# Patient Record
Sex: Male | Born: 1990
Health system: Southern US, Community
[De-identification: ages and names within clinical notes are randomized; demographics above are authoritative.]

## PROBLEM LIST (undated history)

## (undated) DIAGNOSIS — F988 Other specified behavioral and emotional disorders with onset usually occurring in childhood and adolescence: Secondary | ICD-10-CM

## (undated) DIAGNOSIS — F819 Developmental disorder of scholastic skills, unspecified: Secondary | ICD-10-CM

## (undated) HISTORY — DX: Other specified behavioral and emotional disorders with onset usually occurring in childhood and adolescence: F98.8

## (undated) HISTORY — DX: Developmental disorder of scholastic skills, unspecified: F81.9

---
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
## 2011-01-08 NOTE — Letter (Signed)
Summary: Patient Birth Certificate & Adoption Papers  Patient Birth Certificate & Adoption Papers   Imported By: Lanelle Bal 10/17/2010 10:11:55  _____________________________________________________________________  External Attachment:    Type:   Image     Comment:   External Document

## 2011-05-07 ENCOUNTER — Encounter: Payer: Self-pay | Admitting: Internal Medicine

## 2011-05-08 ENCOUNTER — Encounter: Payer: Self-pay | Admitting: Internal Medicine

## 2011-05-08 ENCOUNTER — Ambulatory Visit (INDEPENDENT_AMBULATORY_CARE_PROVIDER_SITE_OTHER): Payer: BC Managed Care – PPO | Admitting: Internal Medicine

## 2011-05-08 VITALS — BP 120/80 | HR 78 | Ht 72.5 in | Wt 176.0 lb

## 2011-05-08 DIAGNOSIS — Z00129 Encounter for routine child health examination without abnormal findings: Secondary | ICD-10-CM

## 2011-05-08 DIAGNOSIS — F8189 Other developmental disorders of scholastic skills: Secondary | ICD-10-CM

## 2011-05-08 DIAGNOSIS — Z0100 Encounter for examination of eyes and vision without abnormal findings: Secondary | ICD-10-CM

## 2011-05-08 DIAGNOSIS — Z789 Other specified health status: Secondary | ICD-10-CM

## 2011-05-08 DIAGNOSIS — Z23 Encounter for immunization: Secondary | ICD-10-CM

## 2011-05-08 DIAGNOSIS — Z0000 Encounter for general adult medical examination without abnormal findings: Secondary | ICD-10-CM | POA: Insufficient documentation

## 2011-05-08 DIAGNOSIS — Z113 Encounter for screening for infections with a predominantly sexual mode of transmission: Secondary | ICD-10-CM

## 2011-05-08 DIAGNOSIS — F988 Other specified behavioral and emotional disorders with onset usually occurring in childhood and adolescence: Secondary | ICD-10-CM

## 2011-05-08 DIAGNOSIS — Z973 Presence of spectacles and contact lenses: Secondary | ICD-10-CM

## 2011-05-08 LAB — BASIC METABOLIC PANEL
Calcium: 9.7 mg/dL (ref 8.4–10.5)
Chloride: 106 mEq/L (ref 96–112)
Creatinine, Ser: 0.9 mg/dL (ref 0.4–1.5)
Glucose, Bld: 79 mg/dL (ref 70–99)
Potassium: 4.4 mEq/L (ref 3.5–5.1)

## 2011-05-08 LAB — CBC WITH DIFFERENTIAL/PLATELET
Eosinophils Absolute: 0.1 10*3/uL (ref 0.0–0.7)
Hemoglobin: 15.1 g/dL (ref 13.0–17.0)
Lymphocytes Relative: 38.7 % (ref 12.0–46.0)
Lymphs Abs: 2 10*3/uL (ref 0.7–4.0)
MCHC: 33.9 g/dL (ref 30.0–36.0)
MCV: 83.2 fl (ref 78.0–100.0)
Monocytes Relative: 6.7 % (ref 3.0–12.0)
Neutrophils Relative %: 51 % (ref 43.0–77.0)
Platelets: 209 10*3/uL (ref 150.0–400.0)
RBC: 5.35 Mil/uL (ref 4.22–5.81)
RDW: 12.8 % (ref 11.5–14.6)

## 2011-05-08 LAB — LIPID PANEL
LDL Cholesterol: 51 mg/dL (ref 0–99)
Total CHOL/HDL Ratio: 2

## 2011-05-08 LAB — HEPATIC FUNCTION PANEL: Alkaline Phosphatase: 80 U/L (ref 39–117)

## 2011-05-08 NOTE — Progress Notes (Signed)
Subjective:     History was provided by the Patient.   Steven Porter is a 20 y.o. male who is here for this wellness visit. He is here to reestablish doctor many years absence. Brought in by his mother today . He is generally well with no history of hospitalizations or major injuries. He recently graduated from high school and is to attend culinary arts school in [place].  He has a remote history of ADHD and an LD when he was much younger and at some point was on medication. He states that he has been able to adapt and try to focus in his schoolwork. He has a driver's license no major accidents. He thinks he will do okay in school without medication.  He would like to get an STI screen despite no symptoms monogamous no condoms   Current Issues: Current concerns include:Development STD testing  H (Home) Family Relationships: good Communication: good with parents Responsibilities: has responsibilities at home  E (Education): Grades: As, Bs, Cs and going to [employer], [employer] in [employer]: good attendance Future Plans: college  A (Activities) Sports: no sports Exercise: Yes  Activities: Cooking, poetry and running Friends: Yes   A (Auton/Safety) Auto: wears seat belt Bike: does not ride Safety: can swim and uses sunscreen  D (Diet) Diet: balanced diet Risky eating habits: none Intake: low fat diet Body Image: positive body image  Drugs Tobacco: History  Alcohol: History Drugs: History  Sex Activity: sexually active  Suicide Risk Emotions: anger and a little; working on it. Depression: denies feelings of depression Suicidal: denies suicidal ideation  ROS; vomited once  After am milkshake.   12 system review  otherwise negative or as above   Objective:     Filed Vitals:   05/08/11 0944  BP: 120/80  Pulse: 78  Height: 6\' 6"  (1.981 m)  Weight: 176 lb (79.833 kg)   Physical Exam: Vital signs reviewed [id number] is a well-developed well-nourished alert  cooperative  AA male  who appears   stated age in no acute distress.  HEENT: normocephalic  traumatic , Eyes: PERRL EOM's full, conjunctiva clear, Nares: patent no deformity discharge or tenderness., Ears: no deformity EAC's clear TMs with normal landmarks. Mouth: clear OP, no lesions, edema.  Moist mucous membranes. Dentition in adequate repair. NECK: supple without masses, thyromegaly or bruits. CHEST/PULM:  Clear to auscultation and percussion breath sounds equal no wheeze , rales or rhonchi. No chest wall deformities or tenderness. CV: PMI is nondisplaced, S1 S2 no gallops, murmurs, rubs. Peripheral pulses are full without delay.No JVD .  ABDOMEN: Bowel sounds normal nontender  No guard or rebound, no hepato splenomegal no CVA tenderness.  No hernia. Extremtities:  No clubbing cyanosis or edema, no acute joint swelling or redness no focal atrophy  No scoliosis  .  NEURO:  Oriented x3, cranial nerves 3-12 appear to be intact, no obvious focal weakness,gait within normal limits no abnormal reflexes or asymmetrical SKIN: No acute rashes normal turgor, color, no bruising or petechiae. Minimal acne papules on forehead PSYCH: Oriented, good eye contact, no obvious depression anxiety, cognition and judgment appear normal. LN:  No cervical axillary or inguinal adenopathy Ext Gu: no masses  Tanner 5      Assessment:    Healthy 19 y.o. male .   History of ADD and LD  Apparently tolerating well and has adapted has not been on medication for quite a while. Did discuss this as an option in the future or retest  if needed. He will probably do well with hands-on learning.   Plan:   1. Anticipatory guidance discussed. Nutrition, Sick Care, Safety and Handout given sti prevention .  Expectant management. At school.  Counseled regarding healthy nutrition, exercise, sleep, injury prevention, calcium vit d and healthy weight .  Recommended immunizations discussed and explained. Questions answered.  Hep  a Menactra and HPV.. HO   2. Follow-up visit in 12 months for next wellness visit, or sooner as needed.

## 2011-05-08 NOTE — Patient Instructions (Signed)
75-19 Year Old Adolescent Visit SCHOOL PERFORMANCE: After high school completion, the older teen may be attending college, Scientist, product/process development or vocational school, or entering the [employer] or the work force. SOCIAL AND EMOTIONAL DEVELOPMENT: The older adolescent establishes adult relationships and explores sexual identity. Teens may be living at home or in a college dorm or apartment. Increasing independence is important as teens enter adulthood. Throughout adolescence, teens should assume care of their own health care. IMMUNIZATIONS: Most older teens should be fully vaccinated. A booster dose of Tdap (tetanus, diphtheria, and pertussis, or "whooping cough"), a dose of meningococcal vaccine to protect against a certain type of bacterial meningitis, Hepatitis A, human papillomarvirus (HPV), chicken pox, or measles vaccines may be indicated, if not given at an earlier age. Annual influenza or "flu" vaccination should be considered during flu season.  TESTING: Annual screening for vision and hearing problems is recommended. Vision should be screened objectively at least once between 20 and 34 years of age. The teen may be screened for anemia or tuberculosis. Teens should have a blood test to check for high cholesterol during this time period. Teens should be screened for use of alcohol and drugs. If the teenager is sexually active, screening for sexually transmitted infections, pregnancy, or HIV may be performed. Screening for cervical cancer should be performed within three years of beginning sexual activity. NUTRITION AND ORAL HEALTH  Adequate calcium intake is important. Take three servings of low fat milk and dairy products daily. For those who do not drink milk or consume dairy products, calcium enriched foods, such as juice, bread, or cereal; dark, green, leafy greens; or canned fish are alternate sources of calcium.   Drink plenty of water. Limit fruit juice to 8 to 12 ounces per day. Avoid sugary beverages  or sodas.   Discourage skipping meals, especially breakfast. Teens should eat a good variety of vegetables and fruits, as well as lean meats.   Avoid high fat, high salt and high sugar choices, such as candy, chips, and cookies.   Encourage teenagers to participate in meal planning and preparation.   Eat meals together as a family whenever possible. Encourage conversation at mealtime.   Limit fast food choices and eating out at restaurants.   Brush teeth twice a day and floss.   Schedule dental examinations twice a year.  DEVELOPMENT SLEEP Regular sleep habits are important. PHYSICAL, SOCIAL AND EMOTIONAL DEVELOPMENT  One hour of regular physical activity daily is recommended. Continue to participate in sports.   Encourage young adults to develop their own interests and consider community service or volunteerism.   Provide guidance to your older teen in making decisions about college and work plans.   Make sure that teens know that they should never be in a situation that makes them uncomfortable, and they should tell partners if they do not want to engage in sexual activity.   Talk to your teen about body image. Eating disorders may be noted at this time. Teens may also be concerned about being overweight. Monitor your teen for weight gain or loss.   Mood disturbances, depression, anxiety, alcoholism, or attention problems may be noted in teenagers. Talk to your doctor if you or your teenager has concerns about mental illness.   Negotiate limit setting and independent decision making.   Encourage your teen to handle conflict without physical violence.   Avoid loud noises which may impair hearing.   Limit television and computer time to 2 hours per day! Individuals who engage in  excessive sedentary activity are more likely to become overweight.  RISK BEHAVIORS  Sexually active teens need to take precautions against pregnancy and sexually transmitted infections. Talk to teens  about contraception.   Provide a tobacco-free and drug-free environment for your teen. Talk to your teen about drug, tobacco, and alcohol use among friends or at friends' homes. Make sure your teen knows that smoking tobacco or marijuana and taking drugs have health consequences and may impact brain development.   Teach your teens about appropriate use of other-the-counter or prescription medications.   Establish guidelines for driving and for riding with friends.   Talk to teens about the risks of drinking and driving or boating. Encourage your teen to call you if the teen or their friends have been drinking or using drugs.   Remind teenagers to wear seatbelts at all times in cars and life vests in boats.   Teens should always wear a properly fitted helmet when they are riding a bicycle.   Use caution with use of all terrain vehicles (ATV) or other motorized vehicles.   Do not keep handguns in the home. (If they are, the gun and ammunition should be locked separately and out of the teen's access).   Equip your home with smoke detectors and change the batteries regularly! Make sure that all family members know fire escape plans.   Teach teens not to swim alone and not to dive in shallow water.   All individuals should wear sunscreen which protects against UV-A and UV-B and is at least sun protection factor of 15 (SPF-15) or higher when out in the sun to minimize sun burning.  WHAT'S NEXT? Teenagers should visit their pediatrician or family physician yearly. By young adulthood, health care should be transitioned to a family physician or internal medicine specialist. Sexually active females may want to begin annual physical examinations with a gynecologist. Document Released: 02/18/2007  [facility] Patient Information 2011 [place], [place].  Will notify you  of labs when available.

## 2011-05-09 LAB — HIV ANTIBODY (ROUTINE TESTING W REFLEX): HIV: NONREACTIVE

## 2011-05-11 NOTE — Progress Notes (Signed)
Pt aware of results 

## 2011-07-10 ENCOUNTER — Ambulatory Visit: Payer: Self-pay | Admitting: Internal Medicine

## 2012-04-11 ENCOUNTER — Encounter: Payer: Self-pay | Admitting: Family

## 2012-04-11 ENCOUNTER — Ambulatory Visit (INDEPENDENT_AMBULATORY_CARE_PROVIDER_SITE_OTHER): Payer: BC Managed Care – PPO | Admitting: Family

## 2012-04-11 VITALS — BP 100/70 | Temp 98.5°F | Wt 131.0 lb

## 2012-04-11 DIAGNOSIS — Z202 Contact with and (suspected) exposure to infections with a predominantly sexual mode of transmission: Secondary | ICD-10-CM

## 2012-04-11 DIAGNOSIS — Z7251 High risk heterosexual behavior: Secondary | ICD-10-CM

## 2012-04-11 MED ORDER — AZITHROMYCIN 250 MG PO TABS: 500.0000 mg | ORAL_TABLET | Freq: Once | ORAL | Status: AC

## 2012-04-11 MED ORDER — CEFTRIAXONE SODIUM 1 G IJ SOLR: 1.0000 g | Freq: Once | INTRAMUSCULAR | Status: DC

## 2012-04-11 MED ORDER — CEFTRIAXONE SODIUM 1 G IJ SOLR
1.0000 g | Freq: Once | INTRAMUSCULAR | Status: AC
  Administered 2012-04-11: 1 g via INTRAMUSCULAR

## 2012-04-11 NOTE — Patient Instructions (Signed)
Chlamydia, Females and Males Chlamydia is an infection that can be found in the vagina, urethra, cervix, rectum and pelvic organs in the male. In the male, it most often causes urethritis. This happens when it infects the tube (urethra) that carries the urine out of the bladder. When Chlamydia causes urethritis, there may be burning with urination. In males, it may also infect the tubes that carry the sperm from the testicle. This causes pain in the testicles and infect the prostate gland. In females, an infection of the pelvic organs is also called PID (pelvic inflammatory disease). PID may be a cause of sudden (acute) lower abdominal/belly (pelvic) pain and fever. But with Chlamydia, the infection sometimes does not cause problems that you notice (asymptomatic). It may cause an abnormal or watery mucous-like discharge from the birth canal (vagina) or penis.  CAUSES  Chlamydia is caused by germs (bacteria) that are spread during sexual contact of the:  Genitals.   Mouth.   Rectum.  This infection may also be passed to a newborn baby coming through the infected birth canal. This causes eye and lung infections in the baby. Chlamydia often goes unnoticed. So it is easy to transmit it to a sexual partner without even knowing. SYMPTOMS  In females, symptoms may go unnoticed. Symptoms that are more noticeable can include:  Belly (abdominal) pain.   Painful intercourse.   Watery mucous-like discharge from the vagina.   Miscarriage.   Discomfort when urinating.   Inflammation of the rectum.  In males, symptoms include:  Burning with urination.   Pain in the testicles.   Watery mucous-like discharge from the penis.  It can cause longstanding (chronic) pelvic pain after frequent infections. TREATMENT   Chlamydia can be treated with medications which kill germs(antibiotics).   Inform all sexual partners about the infection. All sexual contacts need to be treated.   If you are pregnant,  do not take tetracycline type antibiotics.   PID can cause women to not be able to have children (sterile) if left untreated or if half-treated. It does this by scarring the tubes to the uterus (fallopian tubes). They carry the egg needed to form a baby. It is important to finish ALL medications given to you.   Sterility or future tubal (ectopic) pregnancies can occur in fully treated individuals. It is important to follow your prescribed treatment. That will lessen the chances of these problems.   This is a sexually transmitted infection. So you are also at risk for other sexually transmitted diseases. These include: Gonorrhea and HIV (AIDS). Testing may be done for the other sexually transmitted diseases if one disease is detected.   It is important to treat chlamydia as soon as possible. It can cause damage to other organs.  HOME CARE INSTRUCTIONS  Finish all medication as prescribed. Incomplete treatment will put you at risk for not being able to have children (sterility) and tubal pregnancy. If one sexually transmitted disease is discovered, often treatment will be started to cover other possible infections.   Only take over-the-counter or prescription medicines for pain, discomfort, or fever as directed by your caregiver.   Rest.   Eat a balanced diet and drink plenty of fluids.   Warning: This infection is contagious. Do not have sex until treatment is completed. Follow up at your caregiver's office or the clinic to which you were referred. If your diagnosis (learning what is wrong) is confirmed by culture or some other method, your recent sexual contacts need treatment. Even   if they are symptom free or have a negative culture or evaluation, they should be treated.   For the protection of your privacy, test results can not be given over the phone. Make sure you receive the results of your test. Ask how these results are to be obtained if you have not been informed. It is your  responsibility to obtain your test results.  PREVENTION   Women should use sanitary pads instead of tampons for vaginal discharge.   Wipe front to back after using the toilet and avoid douching.   Test for chlamydia if you are having an IUD inserted.   Practice safe sex, use condoms, have only one sex partner and be sure your sex partner is not having sex with others.   Ask your caregiver to test you for chlamydia at your regular checkups or sooner if you are having symptoms.   Ask for further information if you are pregnant.  SEEK IMMEDIATE MEDICAL CARE IF:   You develop an oral temperature above 102 F (38.9 C), not controlled by medications or lasting more than 2 days.   You develop an increase in pain.   You develop any type of abnormal discharge.   You develop vaginal bleeding and it is not time for your period.   You develop painful intercourse.  MAKE SURE YOU:   Understand these instructions.   Will watch your condition.   Will get help right away if you are not doing well or get worse.  Document Released: 11/23/2005 Document Revised: 11/12/2011 Document Reviewed: 06/28/2008 [employer] Patient Information 2012 [employer]. 

## 2012-04-11 NOTE — Progress Notes (Signed)
  Subjective:    Patient ID: Steven Porter, male    DOB: 30-Jul-1991, 21 y.o.   MRN: [id number]  HPI 21 year old [place] American male, nonsmoker, patient of Dr. Fabian Sharp is in today for sexually transmitted disease screening. Reports his girlfriend being positive for Chlamydia. She alleges that she had a from the hospital after giving birth to her 12-month-old baby. He's been having unprotected intercourse with her. Denies any discharge from his penis no burning with urination, no blood in his urine no abdominal pain, or back pain. He is heterosexual, sexually active with one male partner.   Review of Systems  Constitutional: Negative.   Respiratory: Negative.   Cardiovascular: Negative.   Gastrointestinal: Negative.  Negative for abdominal pain.  Genitourinary: Negative.  Negative for dysuria, urgency and genital sores.  Musculoskeletal: Negative.  Negative for back pain.  Psychiatric/Behavioral: Negative.    Past Medical History  Diagnosis Date  . ADD (attention deficit disorder)   . Learning disorder     History   Social History  . Marital Status: Single    Spouse Name: N/A    Number of Children: N/A  . Years of Education: N/A   Occupational History  . Not on file.   Social History Main Topics  . Smoking status: Current Some Day Smoker  . Smokeless tobacco: Never Used  . Alcohol Use: Yes     ocassionaly  . Drug Use: Yes    Special: Marijuana     In the past  . Sexually Active: Not on file   Other Topics Concern  . Not on file   Social History Narrative   Household of 4 negative TADFinished high school [place] go to [employer] in July  In [place].    No past surgical history on file.  Family History  Problem Relation Age of Onset  . Hypertension Mother   . Arthritis    . Sudden death Father   . Urolithiasis    . Colon cancer      GGF  . Other      HD Stroke Ht in [place]    No Known Allergies  No current outpatient prescriptions on file  prior to visit.    BP 100/70  Temp(Src) 98.5 F (36.9 C) (Oral)  Wt 131 lb (59.421 kg)chart    Objective:   Physical Exam  Constitutional: He is oriented to person, place, and time. He appears well-developed and well-nourished.  Cardiovascular: Normal rate, regular rhythm and normal heart sounds.   Pulmonary/Chest: Effort normal and breath sounds normal.  Abdominal: Soft. Bowel sounds are normal.  Genitourinary: Right testis shows no mass, no swelling and no tenderness. Left testis shows no mass, no swelling and no tenderness. Penile tenderness present.  Neurological: He is alert and oriented to person, place, and time.  Skin: Skin is warm and dry.  Psychiatric: He has a normal mood and affect.          Assessment & Plan:  Assessment: Exposure to sexually transmitted diseases, high risk sexual behavior  Plan: 1 g Rocephin given IM x1. Azithromycin 1 g by mouth x1. Refrain from sexual intercourse x1 week. Safe sex practices. Statistical information regarding STDs was discussed at this office visit. Patient verbalizes understanding of the importance of safe sex. Labs sent to include urine GC, HIV, HSV 1 and 2, RPR. Will notify patient pending results.

## 2012-04-12 LAB — RPR

## 2012-04-12 LAB — GC/CHLAMYDIA PROBE AMP, GENITAL
Chlamydia, DNA Probe: NEGATIVE
GC Probe Amp, Genital: NEGATIVE

## 2012-04-12 LAB — HSV(HERPES SIMPLEX VRS) I + II AB-IGG: HSV 2 Glycoprotein G Ab, IgG: <0.1 IV

## 2012-05-19 ENCOUNTER — Telehealth: Payer: Self-pay | Admitting: Internal Medicine

## 2012-05-19 NOTE — Telephone Encounter (Signed)
Pt is moving to the [place] area and wanted to know if DR. Panosh could suggest a doctor in that area

## 2012-05-19 NOTE — Telephone Encounter (Signed)
Not aware of specific doc to recommend

## 2012-05-20 NOTE — Telephone Encounter (Signed)
Mom states that "the young lady already called" about Dr. Rosezella [place] note

## 2012-07-27 ENCOUNTER — Encounter: Payer: Self-pay | Admitting: Internal Medicine

## 2012-07-27 ENCOUNTER — Ambulatory Visit (INDEPENDENT_AMBULATORY_CARE_PROVIDER_SITE_OTHER): Payer: BC Managed Care – PPO | Admitting: Internal Medicine

## 2012-07-27 VITALS — BP 100/70 | HR 70 | Temp 97.8°F | Ht 72.0 in | Wt 131.0 lb

## 2012-07-27 DIAGNOSIS — Z136 Encounter for screening for cardiovascular disorders: Secondary | ICD-10-CM

## 2012-07-27 DIAGNOSIS — F410 Panic disorder [episodic paroxysmal anxiety] without agoraphobia: Secondary | ICD-10-CM

## 2012-07-27 DIAGNOSIS — Z0000 Encounter for general adult medical examination without abnormal findings: Secondary | ICD-10-CM

## 2012-07-27 DIAGNOSIS — F17200 Nicotine dependence, unspecified, uncomplicated: Secondary | ICD-10-CM

## 2012-07-27 DIAGNOSIS — F4322 Adjustment disorder with anxiety: Secondary | ICD-10-CM

## 2012-07-27 DIAGNOSIS — R51 Headache: Secondary | ICD-10-CM

## 2012-07-27 DIAGNOSIS — Z720 Tobacco use: Secondary | ICD-10-CM

## 2012-07-27 LAB — BASIC METABOLIC PANEL WITH GFR
BUN: 12 mg/dL (ref 6–23)
CO2: 29 meq/L (ref 19–32)
Calcium: 9.7 mg/dL (ref 8.4–10.5)
Chloride: 104 meq/L (ref 96–112)
Creatinine, Ser: 0.9 mg/dL (ref 0.4–1.5)
GFR: 140.41 mL/min
Glucose, Bld: 83 mg/dL (ref 70–99)
Potassium: 4.3 meq/L (ref 3.5–5.1)
Sodium: 139 meq/L (ref 135–145)

## 2012-07-27 LAB — CBC WITH DIFFERENTIAL/PLATELET
Basophils Absolute: 0 K/uL (ref 0.0–0.1)
Basophils Relative: 0.8 % (ref 0.0–3.0)
Eosinophils Absolute: 0.2 K/uL (ref 0.0–0.7)
Eosinophils Relative: 2.7 % (ref 0.0–5.0)
HCT: 45.2 % (ref 39.0–52.0)
Hemoglobin: 14.9 g/dL (ref 13.0–17.0)
Lymphocytes Relative: 29.3 % (ref 12.0–46.0)
Lymphs Abs: 1.7 K/uL (ref 0.7–4.0)
MCHC: 32.9 g/dL (ref 30.0–36.0)
MCV: 84.3 fl (ref 78.0–100.0)
Monocytes Absolute: 0.4 K/uL (ref 0.1–1.0)
Monocytes Relative: 6.4 % (ref 3.0–12.0)
Neutro Abs: 3.5 K/uL (ref 1.4–7.7)
Neutrophils Relative %: 60.8 % (ref 43.0–77.0)
Platelets: 214 K/uL (ref 150.0–400.0)
RBC: 5.36 Mil/uL (ref 4.22–5.81)
RDW: 12.8 % (ref 11.5–14.6)
WBC: 5.7 K/uL (ref 4.5–10.5)

## 2012-07-27 LAB — T4, FREE: Free T4: 0.7 ng/dL (ref 0.60–1.60)

## 2012-07-27 LAB — HEPATIC FUNCTION PANEL
ALT: 14 U/L (ref 0–53)
AST: 16 U/L (ref 0–37)
Bilirubin, Direct: 0.1 mg/dL (ref 0.0–0.3)
Total Bilirubin: 1.4 mg/dL — ABNORMAL HIGH (ref 0.3–1.2)
Total Protein: 7.8 g/dL (ref 6.0–8.3)

## 2012-07-27 LAB — LIPID PANEL
Cholesterol: 131 mg/dL (ref 0–200)
HDL: 49.1 mg/dL
LDL Cholesterol: 62 mg/dL (ref 0–99)
Total CHOL/HDL Ratio: 3
Triglycerides: 100 mg/dL (ref 0.0–149.0)
VLDL: 20 mg/dL (ref 0.0–40.0)

## 2012-07-27 LAB — TSH: TSH: 0.91 u[IU]/mL (ref 0.35–5.50)

## 2012-07-27 NOTE — Progress Notes (Signed)
Subjective:    Patient ID: Steven Porter, male    DOB: 12/22/1990, 21 y.o.   MRN: [id number]  HPI Patient comes in today for preventive visit and follow-up of medical issues. Update  history since  last visit: Was seen may for std exposure  See note . Panic anxiety attack  Chest hurt and hard to breath and fell to knees and hard to breath.  Felt faint.some tingling.  Then calmed down    Once or twice   Episodes.  No sig etoh ocass caffiene tob  Recent migraines  More frequent  Lays down  Stress  Has 5 mos old infant mom in [place] went to visit and help now mother of child wont let him see the child and has a new BF?  His family here is supportive.  H (Home) Family Relationships: good Communication: good with parents Responsibilities: has responsibilities at home  E (Education): Grades: Graduated  HS  School: good attendance Future Plans: college back  But needs to get money for job  A (Activities) Sports: no sports Exercise: Yes  Activities: Dance, cooking Friends: Yes   A (Auton/Safety) Auto: wears seat belt Bike: does not ride Safety: can swim  D (Diet) Diet: Recently lost weight by eating healthier Risky eating habits: none Intake: adequate iron and calcium intake Body Image: good   Drugs Tobacco: Y Alcohol: No Drugs: No  Sex Activity:  Suicide Risk Emotions:  Depression: denies feelings of depression Suicidal: denies suicidal ideation caffiene once a day.  A pepsi    Review of Systems  ROS:  GEN/ HEENT: No fever, significant weight changes sweats vision problems hearing changes, CV/ PULM; No  cough, syncope,edema  change in exercise tolerance. GI /GU: No adominal pain, vomiting, change in bowel habits. No blood in the stool. No significant GU symptoms. SKIN/HEME: ,no acute skin rashes suspicious lesions or bleeding. No lymphadenopathy, nodules, masses.  NEURO/ PSYCH:  No neurologic signs such as weakness numbness. No depression anxiety. IMM/ Allergy:  No unusual infections.  Allergy .   REST of 12 system review negative except as per HPI  No outpatient encounter prescriptions on file as of 07/27/2012.       Objective:   Physical Exam BP 100/70  Pulse 70  Temp 97.8 F (36.6 C) (Oral)  Ht 6' (1.829 m)  Wt 131 lb (59.421 kg)  BMI 17.77 kg/m2  SpO2 97%   Physical Exam: Vital signs reviewed [id number] is a well-developed well-nourished alert cooperative  AA male  who appears   stated age in no acute distress.  HEENT: normocephalic  traumatic , Eyes: PERRL EOM's full, conjunctiva clear, Nares: patent no deformity discharge or tenderness., Ears: no deformity EAC's clear TMs with normal landmarks. Mouth: clear OP, no lesions, edema.  Moist mucous membranes. Dentition in adequate repair. NECK: supple without masses, thyromegaly or bruits. CHEST/PULM:  Clear to auscultation and percussion breath sounds equal no wheeze , rales or rhonchi. No chest wall deformities or tenderness. CV: PMI is nondisplaced, S1 S2 no gallops, murmurs, rubs. Peripheral pulses are full without delay.No JVD .  ABDOMEN: Bowel sounds normal nontender  No guard or rebound, no hepato splenomegal no CVA tenderness.  No hernia. Extremtities:  No clubbing cyanosis or edema, no acute joint swelling or redness no focal atrophy NEURO:  Oriented x3, cranial nerves 3-12 appear to be intact, no obvious focal weakness,gait within normal limits no abnormal reflexes or asymmetrical SKIN: No acute rashes normal turgor, color, no bruising or petechiae.  PSYCH: Oriented, good eye contact, no obvious depression anxiety, cognition and judgment appear normal. LN:  No cervical axillary or inguinal adenopathy EKG SR no acute changes sinus 60 early repolarization inferolateral leads  prob normal for age. j point elevation .      Assessment & Plan:  Preventive Health Care Counseled regarding healthy nutrition, exercise, sleep, injury prevention, calcium vit d and healthy weight  .Contraception and Sti prevention.  Poss panic attack anxiety based on hx and context and exam and ekg is normal but will get labs r/o metabolic thyroid disease . Migraines increased under stress    Lay down with help.  Try tylenol and track  Counseled.  Related stress has supports   Family and friends.   Strongly advised  counseling for panic issues  At this time would not use medication as intervention.  But need fu if  persistent or progressive   He is feeling better since back in [place] with his family.  ADD LD  Hx of same  Follow  Counseled.

## 2012-07-27 NOTE — Patient Instructions (Addendum)
  Will contact you about laboratory tests. I believe that you have anxiety related to life events.  Avoid excess alcohol tobacco and caffeine. These can aggravate anxiety and sleep disturbance   Increase exercise as this can help with sleep and anxiety  Suggest counseling to help get through the panic and anxiety we can add medications if advised by counselor or not getting better.  Calendar year headaches to look for triggers lack of sleep food and dehydration can be triggers. You can take Tylenol or ibuprofen if needed but rest is also an okay treatment  By stopping tobacco altogether.

## 2012-07-28 ENCOUNTER — Encounter: Payer: Self-pay | Admitting: Family Medicine

## 2012-07-28 LAB — HEPATITIS C ANTIBODY: HCV Ab: NEGATIVE

## 2012-08-07 ENCOUNTER — Encounter: Payer: Self-pay | Admitting: Internal Medicine

## 2012-08-07 DIAGNOSIS — Z0000 Encounter for general adult medical examination without abnormal findings: Secondary | ICD-10-CM | POA: Insufficient documentation

## 2012-08-07 DIAGNOSIS — R51 Headache: Secondary | ICD-10-CM | POA: Insufficient documentation

## 2012-08-07 DIAGNOSIS — R519 Headache, unspecified: Secondary | ICD-10-CM | POA: Insufficient documentation

## 2012-08-07 DIAGNOSIS — F4322 Adjustment disorder with anxiety: Secondary | ICD-10-CM | POA: Insufficient documentation

## 2012-08-07 DIAGNOSIS — Z720 Tobacco use: Secondary | ICD-10-CM | POA: Insufficient documentation

## 2013-01-07 ENCOUNTER — Emergency Department (HOSPITAL_BASED_OUTPATIENT_CLINIC_OR_DEPARTMENT_OTHER)
Admission: EM | Admit: 2013-01-07 | Discharge: 2013-01-07 | Disposition: A | Discharge status: Home / Self Care | Payer: BC Managed Care – PPO | Attending: Emergency Medicine | Admitting: Emergency Medicine

## 2013-01-07 ENCOUNTER — Encounter (HOSPITAL_BASED_OUTPATIENT_CLINIC_OR_DEPARTMENT_OTHER): Payer: Self-pay | Admitting: *Deleted

## 2013-01-07 DIAGNOSIS — Y9389 Activity, other specified: Secondary | ICD-10-CM | POA: Insufficient documentation

## 2013-01-07 DIAGNOSIS — S161XXA Strain of muscle, fascia and tendon at neck level, initial encounter: Secondary | ICD-10-CM

## 2013-01-07 DIAGNOSIS — S8990XA Unspecified injury of unspecified lower leg, initial encounter: Secondary | ICD-10-CM | POA: Insufficient documentation

## 2013-01-07 DIAGNOSIS — F17200 Nicotine dependence, unspecified, uncomplicated: Secondary | ICD-10-CM | POA: Insufficient documentation

## 2013-01-07 DIAGNOSIS — S0993XA Unspecified injury of face, initial encounter: Secondary | ICD-10-CM | POA: Insufficient documentation

## 2013-01-07 DIAGNOSIS — S99919A Unspecified injury of unspecified ankle, initial encounter: Secondary | ICD-10-CM | POA: Insufficient documentation

## 2013-01-07 DIAGNOSIS — S139XXA Sprain of joints and ligaments of unspecified parts of neck, initial encounter: Secondary | ICD-10-CM | POA: Insufficient documentation

## 2013-01-07 DIAGNOSIS — Y92410 Unspecified street and highway as the place of occurrence of the external cause: Secondary | ICD-10-CM | POA: Insufficient documentation

## 2013-01-07 DIAGNOSIS — S46909A Unspecified injury of unspecified muscle, fascia and tendon at shoulder and upper arm level, unspecified arm, initial encounter: Secondary | ICD-10-CM | POA: Insufficient documentation

## 2013-01-07 DIAGNOSIS — S4980XA Other specified injuries of shoulder and upper arm, unspecified arm, initial encounter: Secondary | ICD-10-CM | POA: Insufficient documentation

## 2013-01-07 DIAGNOSIS — Z8659 Personal history of other mental and behavioral disorders: Secondary | ICD-10-CM | POA: Insufficient documentation

## 2013-01-07 DIAGNOSIS — IMO0002 Reserved for concepts with insufficient information to code with codable children: Secondary | ICD-10-CM | POA: Insufficient documentation

## 2013-01-07 MED ORDER — NAPROXEN 500 MG PO TABS: 500.0000 mg | ORAL_TABLET | Freq: Two times a day (BID) | ORAL | Status: DC

## 2013-01-07 MED ORDER — CYCLOBENZAPRINE HCL 5 MG PO TABS: 5.0000 mg | ORAL_TABLET | Freq: Three times a day (TID) | ORAL | Status: DC | PRN
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
## 2013-10-04 ENCOUNTER — Ambulatory Visit (INDEPENDENT_AMBULATORY_CARE_PROVIDER_SITE_OTHER): Payer: BC Managed Care – PPO | Admitting: Internal Medicine

## 2013-10-04 ENCOUNTER — Encounter: Payer: Self-pay | Admitting: Internal Medicine

## 2013-10-04 VITALS — BP 110/80 | HR 80 | Temp 98.3°F | Ht 72.0 in | Wt 132.0 lb

## 2013-10-04 DIAGNOSIS — Z0000 Encounter for general adult medical examination without abnormal findings: Secondary | ICD-10-CM

## 2013-10-04 DIAGNOSIS — R636 Underweight: Secondary | ICD-10-CM

## 2013-10-04 LAB — T4, FREE: Free T4: 0.79 ng/dL (ref 0.60–1.60)

## 2013-10-04 LAB — CBC WITH DIFFERENTIAL/PLATELET
Basophils Relative: 0.7 % (ref 0.0–3.0)
HCT: 42.9 % (ref 39.0–52.0)
Hemoglobin: 14.5 g/dL (ref 13.0–17.0)
Lymphocytes Relative: 30.5 % (ref 12.0–46.0)
Lymphs Abs: 1.3 10*3/uL (ref 0.7–4.0)
MCV: 81.1 fl (ref 78.0–100.0)
Neutrophils Relative %: 59.5 % (ref 43.0–77.0)
Platelets: 227 10*3/uL (ref 150.0–400.0)
RBC: 5.3 Mil/uL (ref 4.22–5.81)

## 2013-10-04 LAB — BASIC METABOLIC PANEL
Calcium: 9.8 mg/dL (ref 8.4–10.5)
Chloride: 106 mEq/L (ref 96–112)
GFR: 128.7 mL/min (ref >60.00)
Potassium: 4.1 mEq/L (ref 3.5–5.1)

## 2013-10-04 LAB — TSH: TSH: 0.51 u[IU]/mL (ref 0.35–5.50)

## 2013-10-04 LAB — HEPATIC FUNCTION PANEL
ALT: 13 U/L (ref 0–53)
Albumin: 4.3 g/dL (ref 3.5–5.2)
Total Protein: 7.5 g/dL (ref 6.0–8.3)

## 2013-10-04 NOTE — Progress Notes (Signed)
Chief Complaint  Patient presents with  . Annual Exam    HPI: Patient comes in today for Preventive Health Care visit  Trying to gain weight   Hard to do .  Musing protein drinks and nothing happen Pf changs  36 hours  .   Was exercising.  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  Tobacco no   ocass ad mj stopped  Sleep ok 8 hours  Living at home with mom. 1 partner monogomous for over 6 months  ROS:  GEN/ HEENT: No fever, significant weight changes sweats headaches vision problems hearing changes, CV/ PULM; No chest pain shortness of breath cough, syncope,edema  change in exercise tolerance. GI /GU: No adominal pain, vomiting, change in bowel habits. No blood in the stool. No significant GU symptoms. SKIN/HEME: ,no acute skin rashes suspicious lesions or bleeding. No lymphadenopathy, nodules, masses.  NEURO/ PSYCH:  No neurologic signs such as weakness numbness. No depression anxiety. IMM/ Allergy: No unusual infections.  Allergy .   REST of 12 system review negative except as per HPI   Past Medical History  Diagnosis Date  . ADD (attention deficit disorder)   . Learning disorder     Family History  Problem Relation Age of Onset  . Hypertension Mother   . Arthritis    . Sudden death Father   . Urolithiasis    . Colon cancer      GGF  . Other      HD Stroke Ht in Gps    History   Social History  . Marital Status: Single    Spouse Name: N/A    Number of Children: N/A  . Years of Education: N/A   Social History Main Topics  . Smoking status: Former Games developer  . Smokeless tobacco: Former Neurosurgeon    Quit date: 08/04/2013  . Alcohol Use: Yes     Comment: ocassionaly  . Drug Use: Yes    Special: Marijuana     Comment: In the past  . Sexual Activity: None   Other Topics Concern  . None   Social History Narrative   Household of 3 negative TAD  2 dogs    Finished high school [place]   Went to [employer] or [place]  had to drop out  because of fathering a child  And trying to work.    Now living at home  Pf changs   To go to [facility] jan 14 acct.   caffiene not a lot etoh ocass  [address]                 Outpatient Encounter Prescriptions as of 10/04/2013  Medication Sig Dispense Refill  . [DISCONTINUED] cyclobenzaprine (FLEXERIL) 5 MG tablet Take 1 tablet (5 mg total) by mouth 3 (three) times daily as needed for muscle spasms.  21 tablet  0  . [DISCONTINUED] naproxen (NAPROSYN) 500 MG tablet Take 1 tablet (500 mg total) by mouth 2 (two) times daily.  30 tablet  0   No facility-administered encounter medications on file as of 10/04/2013.    EXAM:  BP 110/80  Pulse 80  Temp(Src) 98.3 F (36.8 C) (Oral)  Ht 6' (1.829 m)  Wt 132 lb (59.875 kg)  BMI 17.9 kg/m2  SpO2 99%  Body mass index is 17.9 kg/(m^2).  Marland Kitchen Wt Readings from Last 3 Encounters:  10/04/13 132 lb (59.875 kg)  07/27/12 131 lb (59.421 kg) (13%*, Z = -1.14)  04/11/12 131 lb (59.421 kg) (13%*, Z = -1.11)   * Growth percentiles are based on CDC 2-20 Years data.    Physical Exam: Vital signs reviewed ZOX:WRUE is a well-developed well-nourished alert cooperative   male who appears  stated age in no acute distress.  Slender appears healthy  HEENT: normocephalic atraumatic , Eyes: PERRL EOM's full, conjunctiva clear,  Glasses Nares: paten,t no deformity discharge or tenderness., Ears: no deformity EAC's clear TMs with normal landmarks. Mouth: clear OP, no lesions, edema.  Moist mucous membranes. Dentition in adequate repair. NECK: supple without masses, thyromegaly or bruits. CHEST/PULM:  Clear to auscultation and percussion breath sounds equal no wheeze , rales or rhonchi. No chest wall deformities or tenderness. CV: PMI is nondisplaced, S1 S2 no gallops, murmurs, rubs. Peripheral pulses are full without delay.No JVD .  ABDOMEN: Bowel sounds normal nontender  No guard or rebound, no hepato splenomegal no CVA tenderness.  No hernia. Extremtities:  No  clubbing cyanosis or edema, no acute joint swelling or redness no focal atrophy no scoliosis   gu declined says normal  NEURO:  Oriented x3, cranial nerves 3-12 appear to be intact, no obvious focal weakness,gait within normal limits no abnormal reflexes or asymmetrical SKIN: No acute rashes normal turgor, color, no bruising or petechiae. PSYCH: Oriented, good eye contact, no obvious depression anxiety, cognition and judgment appear normal. LN: no cervical axillary inguinal adenopathy  Lab Results  Component Value Date   WBC 5.7 07/27/2012   HGB 14.9 07/27/2012   HCT 45.2 07/27/2012   PLT 214.0 07/27/2012   GLUCOSE 83 07/27/2012   CHOL 131 07/27/2012   TRIG 100.0 07/27/2012   HDL 49.10 07/27/2012   LDLCALC 62 07/27/2012   ALT 14 07/27/2012   AST 16 07/27/2012   NA 139 07/27/2012   K 4.3 07/27/2012   CL 104 07/27/2012   CREATININE 0.9 07/27/2012   BUN 12 07/27/2012   CO2 29 07/27/2012   TSH 0.91 07/27/2012    ASSESSMENT AND PLAN:  Discussed the following assessment and plan:  Visit for preventive health examination - Plan: Basic metabolic panel, CBC with Differential, Hepatic function panel, TSH, T4, free, RPR, HIV antibody, Celiac panel 10  Underweight - prob heredity disc strategies r/o other may sstill be growing. - Plan: Basic metabolic panel, CBC with Differential, Hepatic function panel, TSH, T4, free, RPR, HIV antibody, Celiac panel 10 Disc screening an labs  To obtained today . i f doing well recheck in a year or as needed  Declined flu vaccine Patient Care Team: Madelin Headings, MD as PCP - General Patient Instructions  Continue lifestyle intervention healthy eating and exercise . Track youre intake  Brink healthy snacks  Such as nuts fruits some cheeses.  Peanut butter avocado.  Will notify you  of labs when available. Checking .  It is possible that your heredity  Keeps you on the slender side but you appear healthy otherwise .   If needed consider seeing nutritionist but  tracking what you eat and drink is the first step.  YOu may be due for tetanus shot when  You are 23. Can do next year  Or any time.     Neta Mends. Panosh M.D.

## 2013-10-04 NOTE — Patient Instructions (Addendum)
Continue lifestyle intervention healthy eating and exercise . Track youre intake  Brink healthy snacks  Such as nuts fruits some cheeses.  Peanut butter avocado.  Will notify you  of labs when available. Checking .  It is possible that your heredity  Keeps you on the slender side but you appear healthy otherwise .   If needed consider seeing nutritionist but tracking what you eat and drink is the first step.  YOu may be due for tetanus shot when  You are 23. Can do next year  Or any time.

## 2013-10-05 LAB — CELIAC PANEL 10
Endomysial Screen: NEGATIVE
IgA: 136 mg/dL (ref 68–379)
Tissue Transglut Ab: 6.2 U/mL (ref <20)

## 2013-10-05 LAB — RPR

## 2017-02-20 ENCOUNTER — Ambulatory Visit (HOSPITAL_COMMUNITY)
Admission: EM | Admit: 2017-02-20 | Discharge: 2017-02-20 | Disposition: A | Discharge status: Home / Self Care | Payer: Worker's Compensation | Attending: Family Medicine | Admitting: Family Medicine

## 2017-02-20 ENCOUNTER — Encounter (HOSPITAL_COMMUNITY): Payer: Self-pay | Admitting: *Deleted

## 2017-02-20 DIAGNOSIS — Z23 Encounter for immunization: Secondary | ICD-10-CM

## 2017-02-20 DIAGNOSIS — S61211A Laceration without foreign body of left index finger without damage to nail, initial encounter: Secondary | ICD-10-CM

## 2017-02-20 MED ORDER — TETANUS-DIPHTH-ACELL PERTUSSIS 5-2.5-18.5 LF-MCG/0.5 IM SUSP
0.5000 mL | Freq: Once | INTRAMUSCULAR | Status: AC
  Administered 2017-02-20: 0.5 mL via INTRAMUSCULAR

## 2017-02-20 MED ORDER — LIDOCAINE HCL 2 % IJ SOLN
INTRAMUSCULAR | Status: AC
  Filled 2017-02-20: qty 20

## 2017-02-20 MED ORDER — HYDROCODONE-ACETAMINOPHEN 5-325 MG PO TABS: 1.0000 | ORAL_TABLET | Freq: Four times a day (QID) | ORAL | 0 refills | Status: DC | PRN

## 2017-02-20 NOTE — Discharge Instructions (Signed)
Try to keep finger dry for the next 24 hours. Return for signs of infection which would be increasing pain, discharge from the finger, increasing redness and swelling

## 2017-02-20 NOTE — ED Provider Notes (Signed)
[facility]    CSN: [id number] Arrival date & time: 02/20/17  1807     History   Chief Complaint Chief Complaint  Patient presents with  . Laceration    HPI Steven Porter is a 26 y.o. male.   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 Unknown tetanus status.      Past Medical History:  Diagnosis Date  . ADD (attention deficit disorder)   . Learning disorder     Patient Active Problem List   Diagnosis Date Noted  . Headache episodic migraines 08/07/2012  . Adjustment disorder with anxiety 08/07/2012  . Visit for preventive health examination 08/07/2012  . Tobacco use 08/07/2012  . Routine general medical examination at a health care facility 05/08/2011  . Screening for STD (sexually transmitted disease) 05/08/2011  . Wears glasses 05/08/2011  . ACNE NEC 08/17/2007  . ADD 07/11/2007  . LEARNING DISABILITY 07/11/2007    History reviewed. No pertinent surgical history.     Home Medications    Prior to Admission medications   Medication Sig Start Date End Date Taking? Authorizing Provider  HYDROcodone-acetaminophen (NORCO) 5-325 MG tablet Take 1 tablet by mouth every 6 (six) hours as needed for moderate pain. 02/20/17   Elvina SidleKurt Ruble Pumphrey, MD    Family History Family History  Problem Relation Age of Onset  . Hypertension Mother   . Sudden death Father   . Arthritis    . Urolithiasis    . Colon cancer      GGF  . Other      HD Stroke Ht in Gps    Social History Social History  Substance Use Topics  . Smoking status: Former Games developermoker  . Smokeless tobacco: Former NeurosurgeonUser    Quit date: 08/04/2013  . Alcohol use Yes     Comment: ocassionaly     Allergies   Patient has no known allergies.   Review of Systems Review of Systems  Constitutional: Negative.   Skin: Positive for wound.     Physical Exam Triage Vital Signs ED Triage Vitals  Enc Vitals Group     BP 02/20/17 1819 130/88   Pulse Rate 02/20/17 1819 78     Resp 02/20/17 1819 18     Temp 02/20/17 1819 98.6 F (37 C)     Temp Source 02/20/17 1819 Oral     SpO2 02/20/17 1819 100 %     Weight --      Height --      Head Circumference --      Peak Flow --      Pain Score 02/20/17 1820 2     Pain Loc --      Pain Edu? --      Excl. in GC? --    No data found.   Updated Vital Signs BP 130/88 (BP Location: Right Arm)   Pulse 78   Temp 98.6 F (37 C) (Oral)   Resp 18   SpO2 100%   Visual Acuity Right Eye Distance:   Left Eye Distance:   Bilateral Distance:    Right Eye Near:   Left Eye Near:    Bilateral Near:     Physical Exam  Constitutional: He is oriented to person, place, and time. He appears well-developed and well-nourished.  HENT:  Right Ear: External ear normal.  Left Ear: External ear normal.  Mouth/Throat: Oropharynx is clear and moist.  Eyes: Conjunctivae and EOM are normal. Pupils  are equal, round, and reactive to light.  Neck: Normal range of motion. Neck supple.  Musculoskeletal: Normal range of motion.  Neurological: He is alert and oriented to person, place, and time.  Skin: Skin is warm and dry.  Patient has a false a 5 x 4 cm skin flap including part of the nail of the left index finger, radial side  Nursing note and vitals reviewed.    UC Treatments / Results  Labs (all labs ordered are listed, but only abnormal results are displayed) Labs Reviewed - No data to display  EKG  EKG Interpretation None       Radiology No results found.  Procedures .Marland KitchenLaceration Repair Date/Time: 02/20/2017 6:53 PM Performed by: Elvina Sidle Authorized by: Elvina Sidle   Consent:    Consent obtained:  Verbal   Consent given by:  Patient   Risks discussed:  Pain   Alternatives discussed:  No treatment Anesthesia (see MAR for exact dosages):    Anesthesia method:  Nerve block   Block anesthetic:  Lidocaine 1% w/o epi   Block technique:  Digital block   Block  injection procedure:  Introduced needle, anatomic landmarks palpated and anatomic landmarks identified   Block outcome:  Anesthesia achieved Laceration details:    Location:  Finger   Finger location:  L index finger   Length (cm):  0.5   Depth (mm):  0.3 Repair type:    Repair type:  Simple Exploration:    Hemostasis achieved with:  Tourniquet   Contaminated: no   Treatment:    Area cleansed with:  Saline   Amount of cleaning:  Standard   Visualized foreign bodies/material removed: no   Skin repair:    Repair method:  Tissue adhesive Approximation:    Approximation:  Loose   Vermilion border: poorly aligned   Post-procedure details:    Dressing:  Bulky dressing   Patient tolerance of procedure:  Tolerated well, no immediate complications   (including critical care time)  Medications Ordered in UC Medications  Tdap (BOOSTRIX) injection 0.5 mL (not administered)     Initial Impression / Assessment and Plan / UC Course  I have reviewed the triage vital signs and the nursing notes.  Pertinent labs & imaging results that were available during my care of the patient were reviewed by me and considered in my medical decision making (see chart for details).     Final Clinical Impressions(s) / UC Diagnoses   Final diagnoses:  Laceration of left index finger without foreign body, nail damage status unspecified, initial encounter    New Prescriptions New Prescriptions   HYDROCODONE-ACETAMINOPHEN (NORCO) 5-325 MG TABLET    Take 1 tablet by mouth every 6 (six) hours as needed for moderate pain.     Elvina Sidle, MD 02/20/17 1859

## 2017-02-20 NOTE — ED Triage Notes (Signed)
Pt  Reports     Cut  His  l  Pointer  Finger   With a  Knife      Today   While  Cutting       Cabbage       today

## 2017-02-20 NOTE — ED Notes (Signed)
Bed: UC01 Expected date:  Expected time:  Means of arrival:  Comments: 

## 2018-06-15 ENCOUNTER — Encounter (HOSPITAL_COMMUNITY): Payer: Self-pay | Admitting: Emergency Medicine

## 2018-06-15 ENCOUNTER — Ambulatory Visit (HOSPITAL_COMMUNITY)
Admission: EM | Admit: 2018-06-15 | Discharge: 2018-06-15 | Disposition: A | Discharge status: Home / Self Care | Payer: BLUE CROSS/BLUE SHIELD | Attending: Family Medicine | Admitting: Family Medicine

## 2018-06-15 DIAGNOSIS — W57XXXA Bitten or stung by nonvenomous insect and other nonvenomous arthropods, initial encounter: Secondary | ICD-10-CM

## 2018-06-15 DIAGNOSIS — S50861A Insect bite (nonvenomous) of right forearm, initial encounter: Secondary | ICD-10-CM | POA: Diagnosis not present

## 2018-06-15 MED ORDER — HYDROCORTISONE 1 % EX CREA: TOPICAL_CREAM | CUTANEOUS | 0 refills | Status: DC

## 2018-06-15 MED ORDER — DIPHENHYDRAMINE HCL 25 MG PO TABS: 25.0000 mg | ORAL_TABLET | Freq: Four times a day (QID) | ORAL | 0 refills | Status: DC

## 2018-06-15 NOTE — Discharge Instructions (Addendum)
It was nice meeting you!!  I believe you are having a reaction to an insect bite. We will prescribe cortisone cream and benadryl for your symptoms.  Follow up with your doctor if no improvement.

## 2018-06-15 NOTE — ED Triage Notes (Signed)
Pt with abscess to right arm

## 2018-06-17 NOTE — ED Provider Notes (Signed)
[facility]    CSN: [id number] Arrival date & time: 06/15/18  1614     History   Chief Complaint Chief Complaint  Patient presents with  . Abscess    HPI Steven Porter is a 27 y.o. male.   Patient is a healthy 27 year old male here with possible insect bite to right forearm.  This is been for 2 days.  Reports that the bite has been itchy.  Denies any fever, chills, body aches, joint aches, other rashes, nausea, vomiting.  Denies any headaches, dizziness, vision changes.  He has not been using anything for the bite.   ROS per HPI      Past Medical History:  Diagnosis Date  . ADD (attention deficit disorder)   . Learning disorder     Patient Active Problem List   Diagnosis Date Noted  . Headache episodic migraines 08/07/2012  . Adjustment disorder with anxiety 08/07/2012  . Visit for preventive health examination 08/07/2012  . Tobacco use 08/07/2012  . Routine general medical examination at a health care facility 05/08/2011  . Screening for STD (sexually transmitted disease) 05/08/2011  . Wears glasses 05/08/2011  . ACNE NEC 08/17/2007  . ADD 07/11/2007  . LEARNING DISABILITY 07/11/2007    History reviewed. No pertinent surgical history.     Home Medications    Prior to Admission medications   Medication Sig Start Date End Date Taking? Authorizing Provider  diphenhydrAMINE (BENADRYL) 25 MG tablet Take 1 tablet (25 mg total) by mouth every 6 (six) hours. 06/15/18   Dahlia Byes A, NP  HYDROcodone-acetaminophen (NORCO) 5-325 MG tablet Take 1 tablet by mouth every 6 (six) hours as needed for moderate pain. Patient not taking: Reported on 06/15/2018 02/20/17   Elvina Sidle, MD  hydrocortisone cream 1 % Apply to affected area 2 times daily 06/15/18   Janace Aris, NP    Family History Family History  Problem Relation Age of Onset  . Hypertension Mother   . Sudden death Father   . Arthritis Unknown   . Urolithiasis Unknown   . Colon cancer  Unknown        GGF  . Other Unknown        HD Stroke Ht in Gps    Social History Social History   Tobacco Use  . Smoking status: Former Games developer  . Smokeless tobacco: Former Neurosurgeon    Quit date: 08/04/2013  Substance Use Topics  . Alcohol use: Yes    Comment: ocassionaly  . Drug use: Yes    Types: Marijuana    Comment: In the past     Allergies   Patient has no known allergies.   Review of Systems Review of Systems   Physical Exam Triage Vital Signs ED Triage Vitals [06/15/18 1647]  Enc Vitals Group     BP 121/78     Pulse Rate 72     Resp 18     Temp 98.4 F (36.9 C)     Temp Source Oral     SpO2 100 %     Weight      Height      Head Circumference      Peak Flow      Pain Score      Pain Loc      Pain Edu?      Excl. in GC?    No data found.  Updated Vital Signs BP 121/78 (BP Location: Left Arm)   Pulse 72   Temp  98.4 F (36.9 C) (Oral)   Resp 18   SpO2 100%   Visual Acuity Right Eye Distance:   Left Eye Distance:   Bilateral Distance:    Right Eye Near:   Left Eye Near:    Bilateral Near:     Physical Exam  Constitutional: He is oriented to person, place, and time. He appears well-developed and well-nourished.  HENT:  Head: Normocephalic.  Pulmonary/Chest: Effort normal.  Neurological: He is alert and oriented to person, place, and time.  Skin: Skin is warm and dry. Capillary refill takes less than 2 seconds.  Wheal with surrounding erythema and swelling to right posterior forearm.    Psychiatric: He has a normal mood and affect.  Nursing note and vitals reviewed.    UC Treatments / Results  Labs (all labs ordered are listed, but only abnormal results are displayed) Labs Reviewed - No data to display  EKG None  Radiology No results found.  Procedures Procedures (including critical care time)  Medications Ordered in UC Medications - No data to display  Initial Impression / Assessment and Plan / UC Course  I have reviewed  the triage vital signs and the nursing notes.  Pertinent labs & imaging results that were available during my care of the patient were reviewed by me and considered in my medical decision making (see chart for details).    Most likely allergic reaction to a mosquito bite.  Prescribed cortisone for inflammation and swelling and Benadryl cream as needed for itching and irritation.  Return precautions given.  Final Clinical Impressions(s) / UC Diagnoses   Final diagnoses:  Insect bite of right forearm, initial encounter     Discharge Instructions     It was nice meeting you!!  I believe you are having a reaction to an insect bite. We will prescribe cortisone cream and benadryl for your symptoms.  Follow up with your doctor if no improvement.     ED Prescriptions    Medication Sig Dispense Auth. Provider   hydrocortisone cream 1 % Apply to affected area 2 times daily 15 g Pami Wool A, NP   diphenhydrAMINE (BENADRYL) 25 MG tablet Take 1 tablet (25 mg total) by mouth every 6 (six) hours. 20 tablet Dahlia ByesBast, [place] Hanlon A, NP     Controlled Substance Prescriptions [place] Controlled Substance Registry consulted? Not Applicable   Janace ArisBast, Staphany Ditton A, NP 06/17/18 1214

## 2018-08-26 ENCOUNTER — Ambulatory Visit (HOSPITAL_COMMUNITY)
Admission: EM | Admit: 2018-08-26 | Discharge: 2018-08-26 | Disposition: A | Discharge status: Home / Self Care | Payer: BLUE CROSS/BLUE SHIELD | Attending: Family Medicine | Admitting: Family Medicine

## 2018-08-26 ENCOUNTER — Ambulatory Visit (INDEPENDENT_AMBULATORY_CARE_PROVIDER_SITE_OTHER): Payer: BLUE CROSS/BLUE SHIELD

## 2018-08-26 ENCOUNTER — Encounter (HOSPITAL_COMMUNITY): Payer: Self-pay | Admitting: Emergency Medicine

## 2018-08-26 DIAGNOSIS — M25561 Pain in right knee: Secondary | ICD-10-CM

## 2018-08-26 DIAGNOSIS — M25562 Pain in left knee: Secondary | ICD-10-CM

## 2018-08-26 MED ORDER — MELOXICAM 7.5 MG PO TABS: 7.5000 mg | ORAL_TABLET | Freq: Every day | ORAL | 0 refills | Status: DC

## 2018-08-26 NOTE — Discharge Instructions (Signed)
It was nice meeting you!!  Your knee x-ray was normal We will have you rest, ice, elevate the knee Knee sleeve for support Meloxicam for pain and inflammation Contact given for orthopedic for further management

## 2018-08-26 NOTE — ED Triage Notes (Signed)
Pt c/o bilateral knee pain, states "I always have problems with them but I worked out a month ago and my left knee still has been hurting". Denies injury. Ambulatory with steady gait.

## 2018-08-29 ENCOUNTER — Encounter (HOSPITAL_COMMUNITY): Payer: Self-pay | Admitting: Family Medicine

## 2018-08-29 NOTE — ED Provider Notes (Signed)
[facility]    CSN: [id number] Arrival date & time: 08/26/18  [id number]     History   Chief Complaint Chief Complaint  Patient presents with  . Knee Pain    HPI Steven Porter is a 27 y.o. male.    Knee Pain  Location:  Knee Injury: no   Knee location:  L knee and R knee (more on the left, right knee has improved) Pain details:    Quality:  Aching   Radiates to:  Does not radiate   Severity:  Mild   Onset quality:  Gradual   Duration:  1 month   Timing:  Intermittent   Progression:  Waxing and waning Chronicity:  New Dislocation: no   Foreign body present:  No foreign bodies Prior injury to area:  No Relieved by:  NSAIDs (sometimes) Worsened by:  Activity, bearing weight and exercise (squatting movements) Associated symptoms: decreased ROM   Associated symptoms: no back pain, no fatigue, no fever, no itching, no muscle weakness, no neck pain, no numbness, no stiffness, no swelling and no tingling   Associated symptoms comment:  Clicking and popping in the knee Risk factors: no concern for non-accidental trauma, no frequent fractures, no known bone disorder, no obesity and no recent illness     Past Medical History:  Diagnosis Date  . ADD (attention deficit disorder)   . Learning disorder     Patient Active Problem List   Diagnosis Date Noted  . Headache episodic migraines 08/07/2012  . Adjustment disorder with anxiety 08/07/2012  . Visit for preventive health examination 08/07/2012  . Tobacco use 08/07/2012  . Routine general medical examination at a health care facility 05/08/2011  . Screening for STD (sexually transmitted disease) 05/08/2011  . Wears glasses 05/08/2011  . ACNE NEC 08/17/2007  . ADD 07/11/2007  . LEARNING DISABILITY 07/11/2007    History reviewed. No pertinent surgical history.     Home Medications    Prior to Admission medications   Medication Sig Start Date End Date Taking? Authorizing Provider  diphenhydrAMINE  (BENADRYL) 25 MG tablet Take 1 tablet (25 mg total) by mouth every 6 (six) hours. Patient not taking: Reported on 08/26/2018 06/15/18   Dahlia ByesBast, Jakwan Sally A, NP  HYDROcodone-acetaminophen (NORCO) 5-325 MG tablet Take 1 tablet by mouth every 6 (six) hours as needed for moderate pain. Patient not taking: Reported on 06/15/2018 02/20/17   Elvina SidleLauenstein, Kurt, MD  hydrocortisone cream 1 % Apply to affected area 2 times daily 06/15/18   Dahlia ByesBast, Lathaniel Legate A, NP  meloxicam (MOBIC) 7.5 MG tablet Take 1 tablet (7.5 mg total) by mouth daily. 08/26/18   Janace ArisBast, Wood Novacek A, NP    Family History Family History  Problem Relation Age of Onset  . Hypertension Mother   . Sudden death Father   . Arthritis Unknown   . Urolithiasis Unknown   . Colon cancer Unknown        GGF  . Other Unknown        HD Stroke Ht in Gps    Social History Social History   Tobacco Use  . Smoking status: Former Games developermoker  . Smokeless tobacco: Former NeurosurgeonUser    Quit date: 08/04/2013  Substance Use Topics  . Alcohol use: Yes    Comment: ocassionaly  . Drug use: Yes    Types: Marijuana    Comment: In the past     Allergies   Patient has no known allergies.   Review of Systems Review of Systems  Constitutional: Negative for fatigue and fever.  Musculoskeletal: Negative for back pain, neck pain and stiffness.  Skin: Negative for itching.     Physical Exam Triage Vital Signs ED Triage Vitals [08/26/18 1033]  Enc Vitals Group     BP 139/87     Pulse Rate 69     Resp 16     Temp 98.2 F (36.8 C)     Temp src      SpO2 100 %     Weight      Height      Head Circumference      Peak Flow      Pain Score      Pain Loc      Pain Edu?      Excl. in GC?    No data found.  Updated Vital Signs BP 139/87   Pulse 69   Temp 98.2 F (36.8 C)   Resp 16   SpO2 100%   Visual Acuity Right Eye Distance:   Left Eye Distance:   Bilateral Distance:    Right Eye Near:   Left Eye Near:    Bilateral Near:     Physical Exam    Constitutional: He is oriented to person, place, and time. He appears well-developed and well-nourished.  Very pleasant. Non toxic or ill appearing.     HENT:  Head: Normocephalic and atraumatic.  Eyes: Conjunctivae are normal.  Neck: Normal range of motion.  Pulmonary/Chest: Breath sounds normal.  Musculoskeletal: Normal range of motion.  Right knee- full ROM and non tender to palpation. No swelling, deformity.  Left knee- Mild tenderness just below the patella. Good ROM. Negative Lachman, no pain with valgus and varus. Mild clicking with flexion and extension.  Sensation and distal pulses intact.   Neurological: He is alert and oriented to person, place, and time.  Skin: Skin is dry.  Psychiatric: He has a normal mood and affect.  Nursing note and vitals reviewed.    UC Treatments / Results  Labs (all labs ordered are listed, but only abnormal results are displayed) Labs Reviewed - No data to display  EKG None  Radiology No results found.  Procedures Procedures (including critical care time)  Medications Ordered in UC Medications - No data to display  Initial Impression / Assessment and Plan / UC Course  I have reviewed the triage vital signs and the nursing notes.  Pertinent labs & imaging results that were available during my care of the patient were reviewed by me and considered in my medical decision making (see chart for details).     X ray negative Most likely meniscus problem based on location of pain, worse with squatting and clicking.  RICE, meloxicam , knee sleeve and follow up with orthopedic if no improvement in the next few weeks.  Pt understanding and agreeable to plan.  Final Clinical Impressions(s) / UC Diagnoses   Final diagnoses:  Acute pain of both knees     Discharge Instructions     It was nice meeting you!!  Your knee x-ray was normal We will have you rest, ice, elevate the knee Knee sleeve for support Meloxicam for pain and  inflammation Contact given for orthopedic for further management    ED Prescriptions    Medication Sig Dispense Auth. Provider   meloxicam (MOBIC) 7.5 MG tablet Take 1 tablet (7.5 mg total) by mouth daily. 30 tablet Dahlia Byes A, NP     Controlled Substance Prescriptions [place] Controlled Substance Registry  consulted? Not Applicable   Janace Aris, NP 08/29/18 1043

## 2019-04-13 ENCOUNTER — Ambulatory Visit (HOSPITAL_COMMUNITY)
Admission: EM | Admit: 2019-04-13 | Discharge: 2019-04-13 | Disposition: A | Discharge status: Home / Self Care | Payer: BLUE CROSS/BLUE SHIELD | Attending: Family Medicine | Admitting: Family Medicine

## 2019-04-13 ENCOUNTER — Encounter (HOSPITAL_COMMUNITY): Payer: Self-pay

## 2019-04-13 ENCOUNTER — Other Ambulatory Visit: Payer: Self-pay

## 2019-04-13 DIAGNOSIS — R197 Diarrhea, unspecified: Secondary | ICD-10-CM

## 2019-04-13 DIAGNOSIS — R1013 Epigastric pain: Secondary | ICD-10-CM

## 2019-04-13 DIAGNOSIS — R112 Nausea with vomiting, unspecified: Secondary | ICD-10-CM

## 2019-04-13 MED ORDER — SIMETHICONE 80 MG PO CHEW: 80.0000 mg | CHEWABLE_TABLET | Freq: Four times a day (QID) | ORAL | 0 refills | Status: DC | PRN

## 2019-04-13 MED ORDER — OMEPRAZOLE 20 MG PO CPDR: 20.0000 mg | DELAYED_RELEASE_CAPSULE | Freq: Two times a day (BID) | ORAL | 0 refills | Status: DC

## 2019-04-13 NOTE — Discharge Instructions (Addendum)
Drink plenty of fluids, especially water and Gatorade Avoid fried foods, spicy foods, and to made dairy products like milk Take the omeprazole 2 times a day.  Take on an empty stomach.  This will reduce your stomach acid and stomach pain Take the simethicone as directed for gas pain If you develop diarrhea again, you would take over-the-counter Imodium.  This works very well You should be able to get back to your regular diet within just a couple of days.  Please call if you are not improving by Monday

## 2019-04-13 NOTE — ED Provider Notes (Signed)
[facility]    CSN: [id number] Arrival date & time: 04/13/19  1939     History   Chief Complaint Chief Complaint  Patient presents with  . Abdominal Pain    HPI Steven Porter is a 28 y.o. male.   HPI  Patient states 2 days ago in the evening he developed some upper abdominal crampy pain.  He states he woke up the next morning and had vomiting, then developed some very loose diarrhea.  No fever or chills.  No cough or respiratory symptoms.  No body aches.  No exposure to coronavirus.  He does not believe he ate anything that disagreed with him.  He does not have any underlying stomach problems.  He does not have any food allergies.  No sick contacts.  He states today he has been able to eat some food and drink fluids.  He still having the abdominal pain.  No vomiting since yesterday.  Last bowel movement this morning, loose but not watery  Past Medical History:  Diagnosis Date  . ADD (attention deficit disorder)   . Learning disorder     Patient Active Problem List   Diagnosis Date Noted  . Headache episodic migraines 08/07/2012  . Adjustment disorder with anxiety 08/07/2012  . Visit for preventive health examination 08/07/2012  . Tobacco use 08/07/2012  . Screening for STD (sexually transmitted disease) 05/08/2011  . Wears glasses 05/08/2011  . ACNE NEC 08/17/2007  . ADD 07/11/2007  . LEARNING DISABILITY 07/11/2007    History reviewed. No pertinent surgical history.     Home Medications    Prior to Admission medications   Medication Sig Start Date End Date Taking? Authorizing Provider  omeprazole (PRILOSEC) 20 MG capsule Take 1 capsule (20 mg total) by mouth 2 (two) times daily before a meal. 04/13/19   Eustace Moore, MD  simethicone (GAS-X) 80 MG chewable tablet Chew 1 tablet (80 mg total) by mouth every 6 (six) hours as needed for flatulence. 04/13/19   Eustace Moore, MD    Family History Family History  Problem Relation Age of Onset  .  Hypertension Mother   . Sudden death Father   . Arthritis Other   . Urolithiasis Other   . Colon cancer Other        GGF  . Other Other        HD Stroke Ht in Gps    Social History Social History   Tobacco Use  . Smoking status: Former Games developer  . Smokeless tobacco: Former Neurosurgeon    Quit date: 08/04/2013  Substance Use Topics  . Alcohol use: Yes    Comment: ocassionaly  . Drug use: Yes    Types: Marijuana    Comment: In the past     Allergies   Patient has no known allergies.   Review of Systems Review of Systems  Constitutional: Negative for chills, fatigue and fever.  HENT: Negative for ear pain and sore throat.   Eyes: Negative for pain and visual disturbance.  Respiratory: Negative for cough and shortness of breath.   Cardiovascular: Negative for chest pain and palpitations.  Gastrointestinal: Positive for abdominal distention, abdominal pain, nausea and vomiting.  Genitourinary: Negative for dysuria and hematuria.  Musculoskeletal: Negative for arthralgias, back pain and myalgias.  Skin: Negative for color change and rash.  Neurological: Negative for seizures and syncope.  All other systems reviewed and are negative.    Physical Exam Triage Vital Signs ED Triage Vitals  Enc  Vitals Group     BP 04/13/19 1956 (!) 116/57     Pulse Rate 04/13/19 1956 67     Resp 04/13/19 1956 20     Temp 04/13/19 1956 98.9 F (37.2 C)     Temp src --      SpO2 04/13/19 1956 100 %     Weight --      Height --      Head Circumference --      Peak Flow --      Pain Score 04/13/19 1957 4     Pain Loc --      Pain Edu? --      Excl. in GC? --    No data found.  Updated Vital Signs BP (!) 116/57   Pulse 67   Temp 98.9 F (37.2 C)   Resp 20   SpO2 100%      Physical Exam Constitutional:      General: He is not in acute distress.    Appearance: He is well-developed. He is not ill-appearing.  HENT:     Head: Normocephalic and atraumatic.     Mouth/Throat:      Mouth: Mucous membranes are moist.  Eyes:     Conjunctiva/sclera: Conjunctivae normal.     Pupils: Pupils are equal, round, and reactive to light.  Neck:     Musculoskeletal: Normal range of motion.  Cardiovascular:     Rate and Rhythm: Normal rate and regular rhythm.     Heart sounds: Normal heart sounds.  Pulmonary:     Effort: Pulmonary effort is normal. No respiratory distress.     Breath sounds: Normal breath sounds.  Abdominal:     General: Bowel sounds are increased. There is no distension.     Palpations: Abdomen is soft. There is no hepatomegaly or splenomegaly.     Tenderness: There is abdominal tenderness in the epigastric area. There is no guarding or rebound.     Hernia: No hernia is present.  Musculoskeletal: Normal range of motion.  Skin:    General: Skin is warm and dry.  Neurological:     General: No focal deficit present.     Mental Status: He is alert.  Psychiatric:        Mood and Affect: Mood normal.        Behavior: Behavior normal.      UC Treatments / Results  Labs (all labs ordered are listed, but only abnormal results are displayed) Labs Reviewed - No data to display  EKG None  Radiology No results found.  Procedures Procedures (including critical care time)  Medications Ordered in UC Medications - No data to display  Initial Impression / Assessment and Plan / UC Course  I have reviewed the triage vital signs and the nursing notes.  Pertinent labs & imaging results that were available during my care of the patient were reviewed by me and considered in my medical decision making (see chart for details).    Explained to the patient that he likely had a viral gastroenteritis.  It is left him with some gastric irritation.  I believe a medicine like omeprazole will help him for short period of time.  He may stop it when he feels better.  Also giving him Mylicon for the hyperactive bowel sounds and gas.  He should slowly increase his diet.   Push fluids.  Let us know if he is not better in a couple days Final Clinical Impressions(s) / UC Diagnoses  Final diagnoses:  Epigastric pain  Nausea vomiting and diarrhea     Discharge Instructions     Drink plenty of fluids, especially water and Gatorade Avoid fried foods, spicy foods, and to made dairy products like milk Take the omeprazole 2 times a day.  Take on an empty stomach.  This will reduce your stomach acid and stomach pain Take the simethicone as directed for gas pain If you develop diarrhea again, you would take over-the-counter Imodium.  This works very well You should be able to get back to your regular diet within just a couple of days.  Please call if you are not improving by Monday   ED Prescriptions    Medication Sig Dispense Auth. Provider   omeprazole (PRILOSEC) 20 MG capsule Take 1 capsule (20 mg total) by mouth 2 (two) times daily before a meal. 30 capsule Eustace MooreNelson, Tyreisha Ungar Sue, MD   simethicone (GAS-X) 80 MG chewable tablet Chew 1 tablet (80 mg total) by mouth every 6 (six) hours as needed for flatulence. 30 tablet Eustace MooreNelson, Huber Mathers Sue, MD     Controlled Substance Prescriptions [place] Controlled Substance Registry consulted? Not Applicable   Eustace MooreNelson, Adama Ivins Sue, MD 04/13/19 2017

## 2019-04-13 NOTE — ED Triage Notes (Signed)
Pt states he had vomiting and diarrhea yesterday , abdominal and epigastric pain today

## 2019-08-01 ENCOUNTER — Ambulatory Visit (HOSPITAL_COMMUNITY)
Admission: EM | Admit: 2019-08-01 | Discharge: 2019-08-01 | Disposition: A | Discharge status: Home / Self Care | Payer: Self-pay

## 2019-08-01 ENCOUNTER — Emergency Department (HOSPITAL_COMMUNITY)
Admission: EM | Admit: 2019-08-01 | Discharge: 2019-08-01 | Disposition: A | Discharge status: Home / Self Care | Payer: Self-pay | Attending: Emergency Medicine | Admitting: Emergency Medicine

## 2019-08-01 ENCOUNTER — Encounter (HOSPITAL_COMMUNITY): Payer: Self-pay

## 2019-08-01 ENCOUNTER — Other Ambulatory Visit: Payer: Self-pay

## 2019-08-01 DIAGNOSIS — F329 Major depressive disorder, single episode, unspecified: Secondary | ICD-10-CM

## 2019-08-01 DIAGNOSIS — Z87891 Personal history of nicotine dependence: Secondary | ICD-10-CM | POA: Insufficient documentation

## 2019-08-01 DIAGNOSIS — Z9189 Other specified personal risk factors, not elsewhere classified: Secondary | ICD-10-CM

## 2019-08-01 DIAGNOSIS — F32A Depression, unspecified: Secondary | ICD-10-CM

## 2019-08-01 DIAGNOSIS — F419 Anxiety disorder, unspecified: Secondary | ICD-10-CM

## 2019-08-01 DIAGNOSIS — R45851 Suicidal ideations: Secondary | ICD-10-CM | POA: Insufficient documentation

## 2019-08-01 DIAGNOSIS — F321 Major depressive disorder, single episode, moderate: Secondary | ICD-10-CM | POA: Insufficient documentation

## 2019-08-01 LAB — CBC WITH DIFFERENTIAL/PLATELET
Abs Immature Granulocytes: 0.01 10*3/uL (ref 0.00–0.07)
Basophils Absolute: 0.1 10*3/uL (ref 0.0–0.1)
Basophils Relative: 1 %
Eosinophils Absolute: 0.1 10*3/uL (ref 0.0–0.5)
Eosinophils Relative: 1 %
HCT: 46.2 % (ref 39.0–52.0)
Hemoglobin: 15.4 g/dL (ref 13.0–17.0)
Immature Granulocytes: 0 %
Lymphocytes Relative: 36 %
Lymphs Abs: 2.5 10*3/uL (ref 0.7–4.0)
MCH: 27.5 pg (ref 26.0–34.0)
MCHC: 33.3 g/dL (ref 30.0–36.0)
MCV: 82.6 fL (ref 80.0–100.0)
Monocytes Absolute: 0.6 10*3/uL (ref 0.1–1.0)
Monocytes Relative: 8 %
Neutro Abs: 3.7 10*3/uL (ref 1.7–7.7)
Neutrophils Relative %: 54 %
Platelets: 238 10*3/uL (ref 150–400)
RBC: 5.59 MIL/uL (ref 4.22–5.81)
RDW: 12.5 % (ref 11.5–15.5)
WBC: 6.9 10*3/uL (ref 4.0–10.5)
nRBC: 0 % (ref 0.0–0.2)

## 2019-08-01 LAB — COMPREHENSIVE METABOLIC PANEL
ALT: 51 U/L — ABNORMAL HIGH (ref 0–44)
AST: 31 U/L (ref 15–41)
Albumin: 4.8 g/dL (ref 3.5–5.0)
Alkaline Phosphatase: 70 U/L (ref 38–126)
Anion gap: 10 (ref 5–15)
BUN: 14 mg/dL (ref 6–20)
CO2: 25 mmol/L (ref 22–32)
Calcium: 9.5 mg/dL (ref 8.9–10.3)
Chloride: 105 mmol/L (ref 98–111)
Creatinine, Ser: 1.09 mg/dL (ref 0.61–1.24)
GFR calc Af Amer: >60 mL/min (ref >60)
GFR calc non Af Amer: >60 mL/min (ref >60)
Glucose, Bld: 92 mg/dL (ref 70–99)
Potassium: 3.6 mmol/L (ref 3.5–5.1)
Sodium: 140 mmol/L (ref 135–145)
Total Bilirubin: 1.7 mg/dL — ABNORMAL HIGH (ref 0.3–1.2)
Total Protein: 8.4 g/dL — ABNORMAL HIGH (ref 6.5–8.1)

## 2019-08-01 LAB — ETHANOL: Alcohol, Ethyl (B): <10 mg/dL (ref <10)

## 2019-08-01 NOTE — ED Provider Notes (Signed)
[facility] Emergency Department Provider Note MRN:  [id number]  Arrival date & time: 08/01/19     Chief Complaint   Suicidal   History of Present Illness   Steven Porter is a 28 y.o. year-old male with a history of ADD presenting to the ED with chief complaint of suicidal.  Patient has been having increased arguments with wife.  This is causing him significant stress.  Feels bad about saying mean things to wife.  Yesterday was quite depressed.  Thought about drinking alcohol heavily and then intentionally walking into traffic to kill self.  Here because of these thoughts concerned him and he wants help.  Denies drug or alcohol use, no AVH, no HI.  Currently without any other pain or complaints.  Review of Systems  A complete 10 system review of systems was obtained and all systems are negative except as noted in the HPI and PMH.   Patient's Health History    Past Medical History:  Diagnosis Date  . ADD (attention deficit disorder)   . Learning disorder     History reviewed. No pertinent surgical history.  Family History  Problem Relation Age of Onset  . Hypertension Mother   . Sudden death Father   . Arthritis Other   . Urolithiasis Other   . Colon cancer Other        GGF  . Other Other        HD Stroke Ht in Gps    Social History   Socioeconomic History  . Marital status: Married    Spouse name: Not on file  . Number of children: Not on file  . Years of education: Not on file  . Highest education level: Not on file  Occupational History  . Not on file  Social Needs  . Financial resource strain: Not on file  . Food insecurity    Worry: Not on file    Inability: Not on file  . Transportation needs    Medical: Not on file    Non-medical: Not on file  Tobacco Use  . Smoking status: Former Research scientist (life sciences)  . Smokeless tobacco: Former Systems developer    Quit date: 08/04/2013  Substance and Sexual Activity  . Alcohol use: Yes    Comment: ocassionaly  .  Drug use: Yes    Types: Marijuana    Comment: In the past  . Sexual activity: Not on file  Lifestyle  . Physical activity    Days per week: Not on file    Minutes per session: Not on file  . Stress: Not on file  Relationships  . Social Herbalist on phone: Not on file    Gets together: Not on file    Attends religious service: Not on file    Active member of club or organization: Not on file    Attends meetings of clubs or organizations: Not on file    Relationship status: Not on file  . Intimate partner violence    Fear of current or ex partner: Not on file    Emotionally abused: Not on file    Physically abused: Not on file    Forced sexual activity: Not on file  Other Topics Concern  . Not on file  Social History Narrative   Household of 3 negative TAD  2 dogs    Finished high school [place] to [employer] or [place]  had to drop out because of fathering  a child  And trying to work.    Now living at home  [employer]   To go to [employer] jan 14 acct.   caffiene not a lot etoh ocass  [address]                  Physical Exam  Vital Signs and Nursing Notes reviewed Vitals:   08/01/19 2031  BP: (!) 151/92  Pulse: 91  Resp: 16  Temp: 99.2 F (37.3 C)  SpO2: 100%    CONSTITUTIONAL: Well-appearing, NAD NEURO:  Alert and oriented x 3, no focal deficits EYES:  eyes equal and reactive ENT/NECK:  no LAD, no JVD CARDIO: Regular rate, well-perfused, normal S1 and S2 PULM:  CTAB no wheezing or rhonchi GI/GU:  normal bowel sounds, non-distended, non-tender MSK/SPINE:  No gross deformities, no edema SKIN:  no rash, atraumatic PSYCH: Mildly depressed speech and behavior  Diagnostic and Interventional Summary    Labs Reviewed  COMPREHENSIVE METABOLIC PANEL  ETHANOL  RAPID URINE DRUG SCREEN, HOSP PERFORMED  CBC WITH DIFFERENTIAL/PLATELET    No orders to display    Medications - No data to display   Procedures Critical Care  ED  Course and Medical Decision Making  I have reviewed the triage vital signs and the nursing notes.  Pertinent labs & imaging results that were available during my care of the patient were reviewed by me and considered in my medical decision making (see below for details).  Suicidal ideation, will consult TTS for recommendations.  Awaiting labs but anticipating medical clearance.  Signed out to default provider.  Elmer SowMichael M. Pilar PlateBero, MD [facility] [email or web address]  Final Clinical Impressions(s) / ED Diagnoses     ICD-10-CM   1. Suicidal ideation  R45.851     ED Discharge Orders    None      Discharge Instructions Discussed with and Provided to Patient: Discharge Instructions   None       Sabas SousBero, Jaimere Feutz M, MD 08/01/19 2147

## 2019-08-01 NOTE — ED Provider Notes (Signed)
MRN: [id number] DOB: 1991/03/22  Subjective:   Steven Porter is a 28 y.o. male presenting for mental health evaluation.  Patient reports having thoughts about suicide last night including an active plan to drink an excessive amount of alcohol and end his life.  This was brought on by difficult conversations and arguments with his wife.  Reports that he hit rock bottom last night, feels like he cannot continue living like this and really needs to focus on getting better before he can do anything else.  He has never taken any mental health medicine except for Adderall when he was younger for learning disability, ADD.  He has history of self-harm then as well but never suicide attempt.  He had stopped hurting himself for years after he was a teenager up until recently with his struggles in his marriage, communication.  He was drinking in February and March, felt angry with his wife and communication issues. Has since calmed down. Admits having had difficulty with communication with his wife. Has not gotten worse but feels that disagreements get really out of control. Denies active SI, HI. Denies drug use currently, used to smoke marijuana. Denies smoking cigarettes.  [place]  He states that he only has his wife and his kids.  Also admits that his wife is unaware of the reason why he is here due to the difficulty that they have in their marriage and with her communication.  Patient states that he does want to continue living for his kids but feels very unsafe with himself.  He denies a current active plan to commit suicide but feels like he still has the urge to hurt himself.   Denies taking any current medications.     No Known Allergies   Past Medical History:  Diagnosis Date  . ADD (attention deficit disorder)   . Learning disorder      Denies past surgical history.    ROS  Objective:   Vitals: BP 132/80 (BP Location:  Left Arm)   Pulse 88   Temp 98.6 F (37 C) (Oral)   Resp 16   SpO2 100%   Physical Exam Constitutional:      Appearance: Normal appearance. He is well-developed and normal weight.  HENT:     Head: Normocephalic and atraumatic.     Right Ear: External ear normal.     Left Ear: External ear normal.     Nose: Nose normal.     Mouth/Throat:     Pharynx: Oropharynx is clear.  Eyes:     Extraocular Movements: Extraocular movements intact.     Pupils: Pupils are equal, round, and reactive to light.  Cardiovascular:     Rate and Rhythm: Normal rate.  Pulmonary:     Effort: Pulmonary effort is normal.  Neurological:     Mental Status: He is alert and oriented to person, place, and time.  Psychiatric:        Attention and Perception: He is attentive. He does not perceive auditory or visual hallucinations.        Mood and Affect: Mood is depressed. Mood is not anxious or elated. Affect is flat and tearful. Affect is not labile, blunt, angry or inappropriate.        Speech: He is communicative. Speech is not rapid and pressured, delayed, slurred or tangential.        Behavior: Behavior is slowed. Behavior is not agitated, aggressive, withdrawn, hyperactive or  combative. Behavior is cooperative.        Thought Content: Thought content is not paranoid or delusional. Thought content includes suicidal ideation. Thought content does not include homicidal ideation. Thought content includes suicidal plan. Thought content does not include homicidal plan.        Cognition and Memory: Cognition is not impaired. Memory is not impaired. He does not exhibit impaired recent memory or impaired remote memory.        Judgment: Judgment is not impulsive or inappropriate.     Assessment and Plan :   1. Suicidal thoughts   2. Anxiety   3. Depression, unspecified depression type   4. At high risk for self harm     I attempted to call and obtain consult with [facility] on call as provided through [place] and had  no answer, no response to voicemail; also was advised by another provider listed that they were listed incorrectly.  Patient is very high risk for self-harm given suicidal thoughts and reported self-harm last night.  He is in need of an urgent consult with behavioral health.  I have historically had better response with [facility] long emergency as they have better access, streamlined process to help with emergent behavioral health cases.  I discussed this with patient including methods of transportation.  He refused to have his wife involved as this is a source of his difficulties.  RN Franchot Erichsen contacted multiple mental health lines and we are unable to attain a viable option.  I discussed this with patient and he was agreeable to [place] contacting [facility] for help with this.  They arrived and abided by their mental health policies to contact mental health specialist for an in person evaluation.  They were in agreement with our plan to have patient evaluated in the emergency setting.  Patient is very cooperative and pleading for help, request to be transported to [facility] long emergency room.    Jaynee Eagles, [place] 08/01/19 2023

## 2019-08-01 NOTE — ED Notes (Signed)
Patient says suicide was a split second thought and is no longer thinking of committing suicide.

## 2019-08-01 NOTE — ED Notes (Signed)
Dr. Bess Harvest notified of patient's suicidal ideation

## 2019-08-01 NOTE — ED Triage Notes (Signed)
Pt here from Urgent Care where he went to be evaluated for SI and they sent him here Pt states that he's had suicidal thoughts for years but has never acted on them He states that since he's been married for the last five years hes been trying to work and get everything together and he doesn't feel like anything is good enough

## 2019-08-01 NOTE — Discharge Instructions (Signed)
We saw you in the ER for your mental health concerns and had our behavioral health team evaluate you. The team feels comfortable sending you home. Please refrain from substance abuse. Return to the ER if your symptoms worsen.

## 2019-08-01 NOTE — ED Notes (Signed)
Patient is being discharged from the Urgent [facility] [place] [facility] and sent to the Emergency Department via wheelchair by staff. Per Jaynee Eagles, PA, patient is stable but in need of higher level of care due to requested psychiatric care. Patient is aware and verbalizes understanding of plan of care.  Vitals:   08/01/19 1758  BP: 132/80  Pulse: 88  Resp: 16  Temp: 98.6 F (37 C)  SpO2: 100%

## 2019-08-01 NOTE — ED Triage Notes (Signed)
Pt presents to UC since 4 days. Pt reports having anxiety for years and has tried different remedies without success. Pt reports harming himself during this past 4 days by hitting himself.
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
## 2019-08-18 IMAGING — DX DG KNEE COMPLETE 4+V*L*
4 series · 4 of 4 positions shown · non-contrast
Comparison: None.

CLINICAL DATA: Right knee pain for 1 month.

EXAM:
LEFT KNEE - COMPLETE 4+ VIEW

[knee ap]
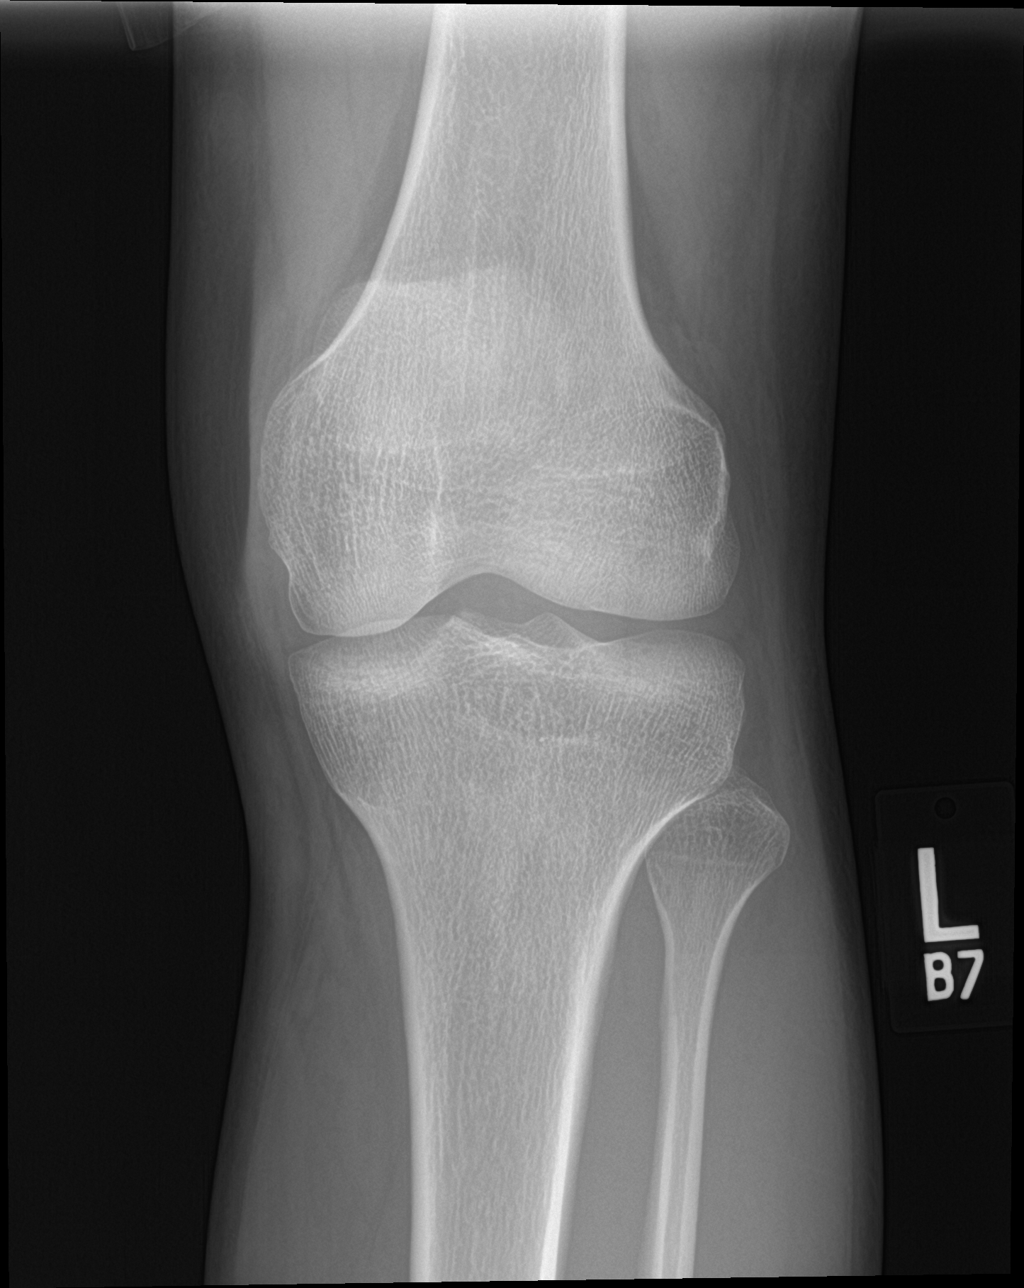

[knee obl (1 of 2)]
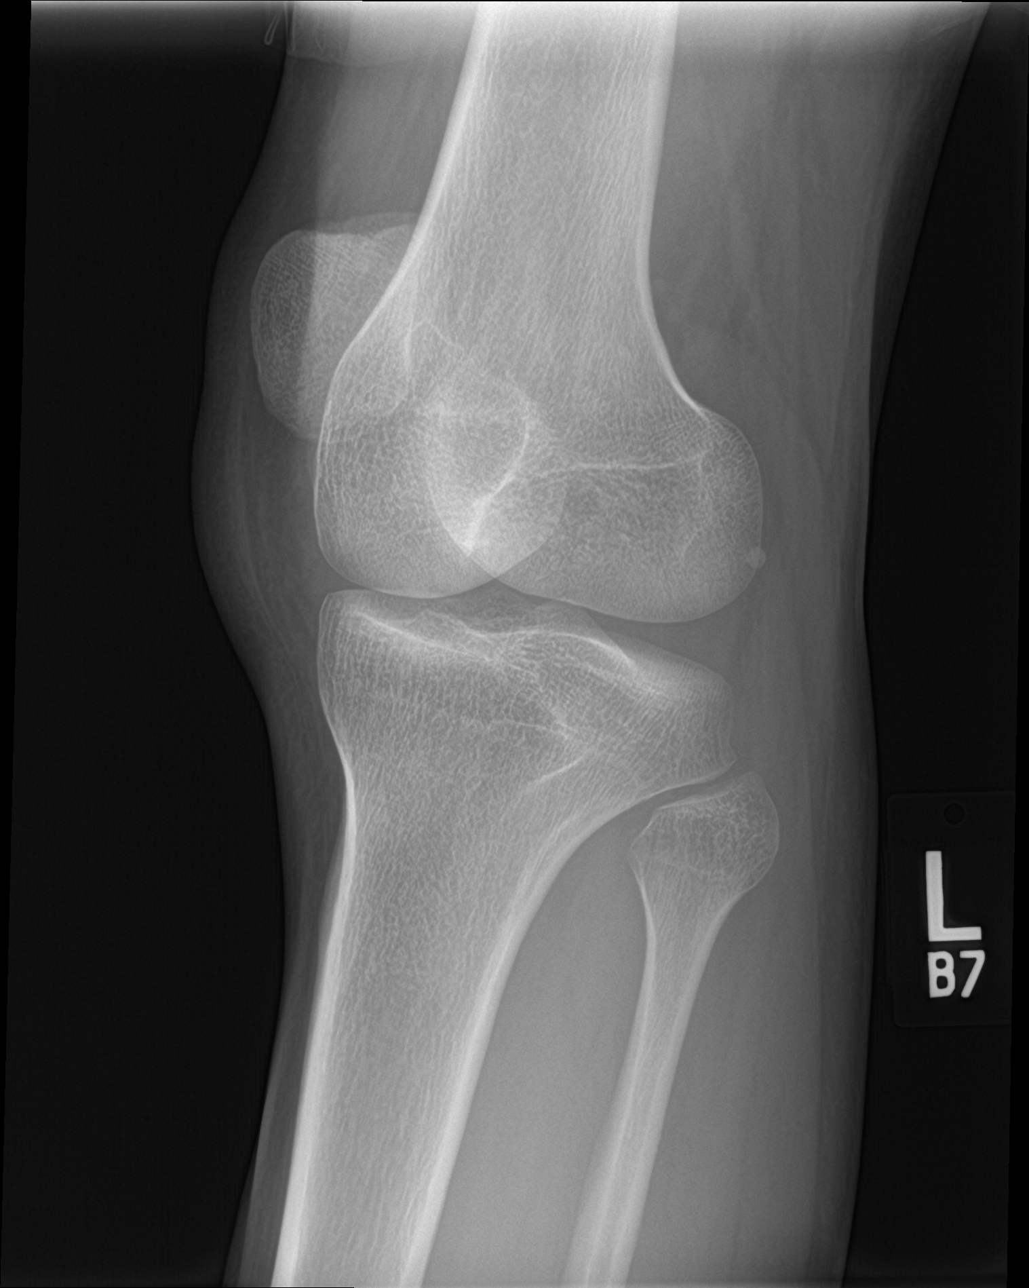

[knee obl (2 of 2)]
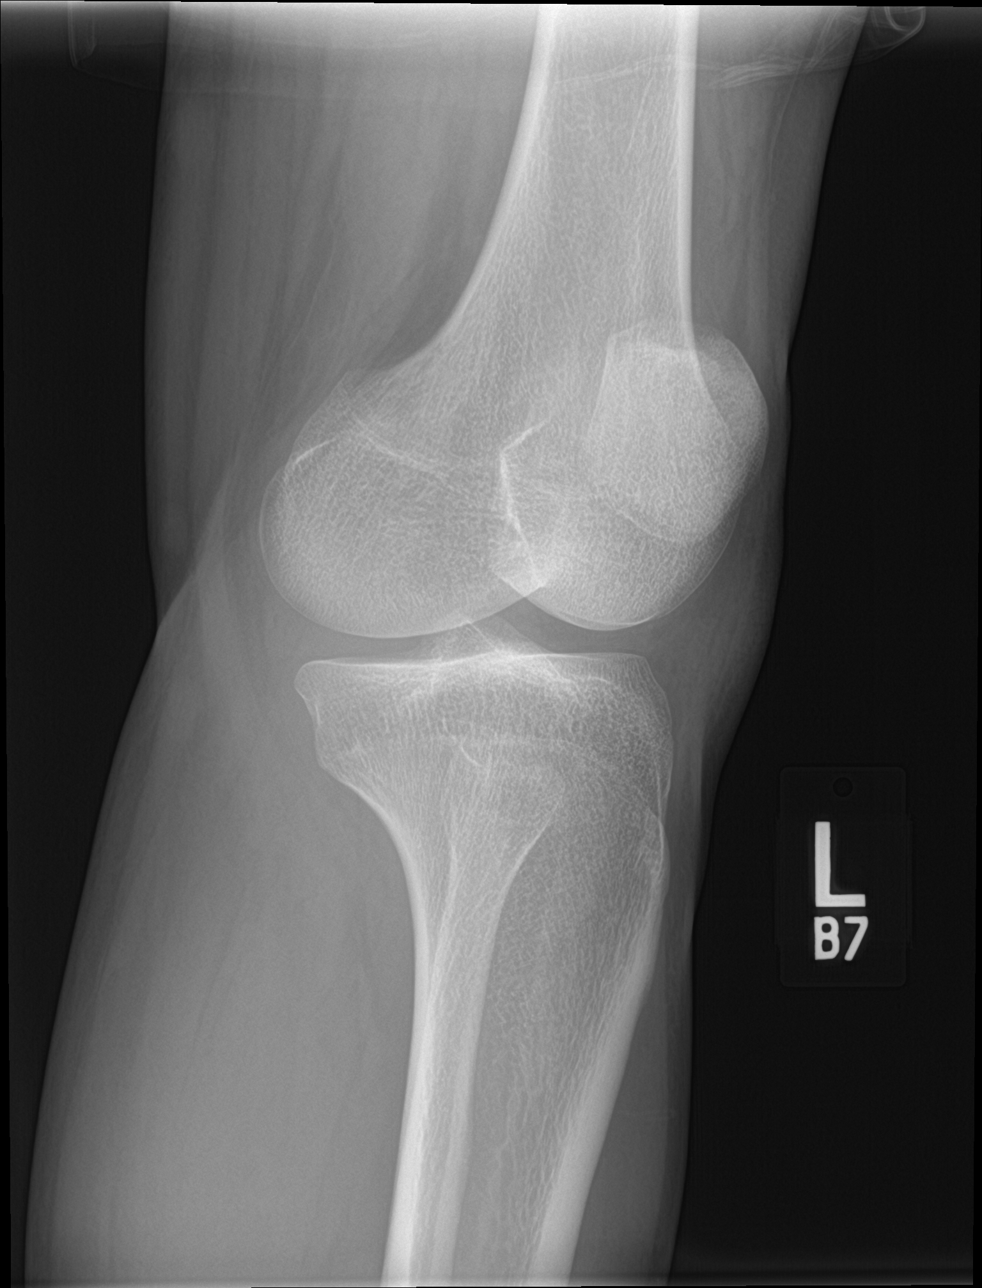

[knee lat]
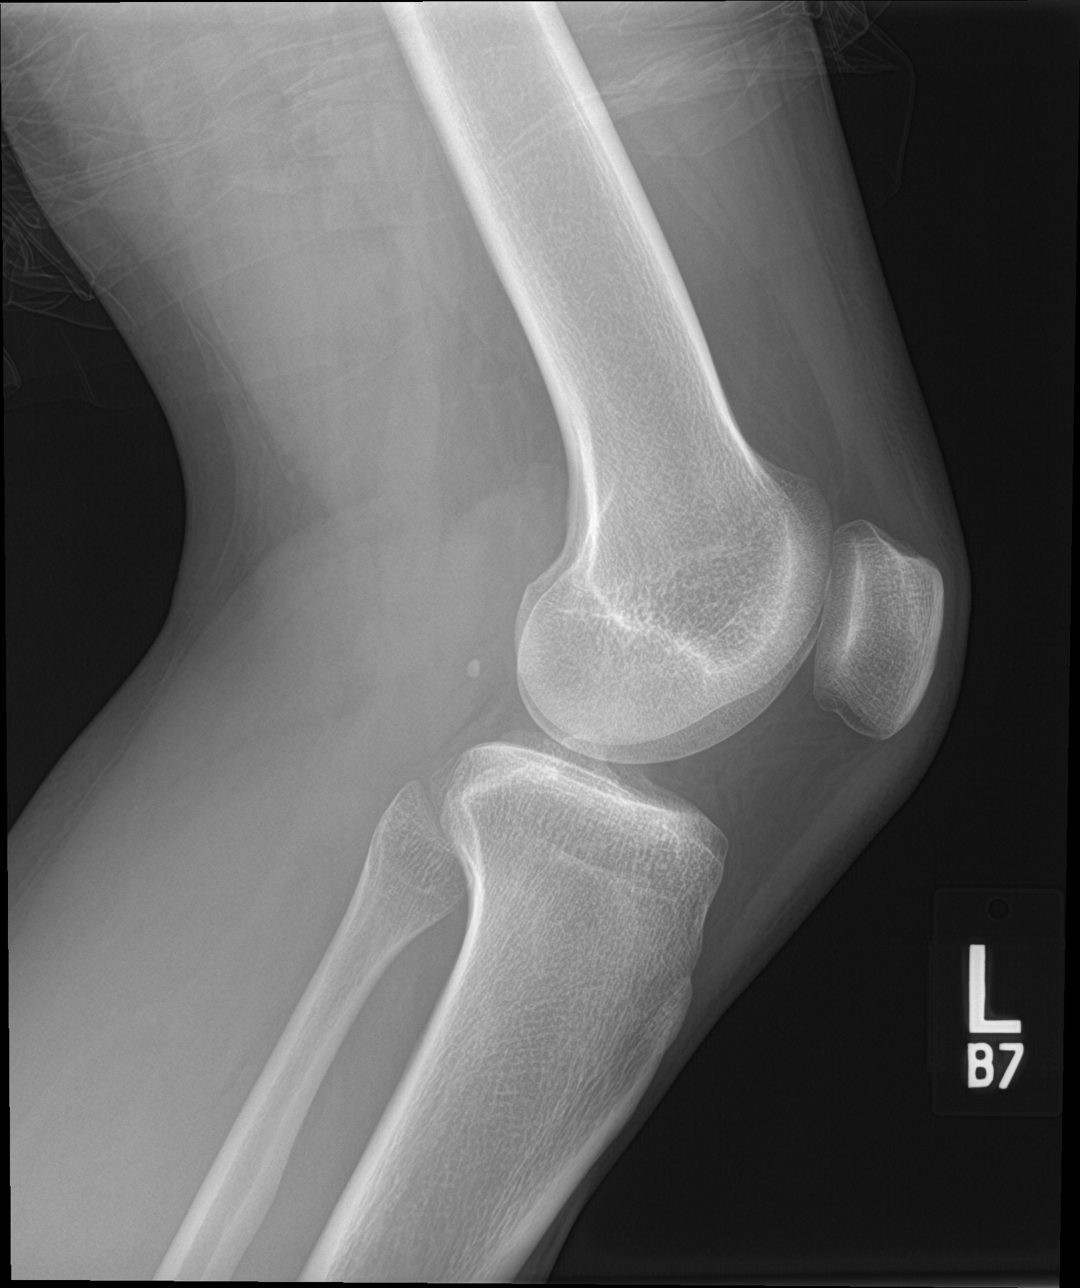

[4 of 4 positions shown; findings below may reference images not displayed]

FINDINGS: The joint spaces are maintained. No acute bony findings,
osteochondral lesion or joint effusion.
IMPRESSION: Normal left knee radiographs.

## 2019-11-13 ENCOUNTER — Encounter (HOSPITAL_COMMUNITY): Payer: Self-pay

## 2019-11-13 ENCOUNTER — Other Ambulatory Visit: Payer: Self-pay

## 2019-11-13 ENCOUNTER — Ambulatory Visit (HOSPITAL_COMMUNITY)
Admission: EM | Admit: 2019-11-13 | Discharge: 2019-11-13 | Disposition: A | Discharge status: Home / Self Care | Payer: BC Managed Care – PPO | Attending: Family Medicine | Admitting: Family Medicine

## 2019-11-13 DIAGNOSIS — R3914 Feeling of incomplete bladder emptying: Secondary | ICD-10-CM | POA: Diagnosis not present

## 2019-11-13 DIAGNOSIS — R350 Frequency of micturition: Secondary | ICD-10-CM

## 2019-11-13 LAB — POCT URINALYSIS DIP (DEVICE)
Bilirubin Urine: NEGATIVE
Glucose, UA: NEGATIVE mg/dL
Hgb urine dipstick: NEGATIVE
Ketones, ur: NEGATIVE mg/dL
Leukocytes,Ua: NEGATIVE
Nitrite: NEGATIVE
Protein, ur: NEGATIVE mg/dL
Specific Gravity, Urine: 1.01 (ref 1.005–1.030)
Urobilinogen, UA: 0.2 mg/dL (ref 0.0–1.0)
pH: 6 (ref 5.0–8.0)

## 2019-11-13 NOTE — ED Provider Notes (Signed)
[facility]    CSN: [id number] Arrival date & time: 11/13/19  1920      History   Chief Complaint Chief Complaint  Patient presents with  . Urinary Retention    HPI Steven Porter is a 28 y.o. male.   HPI  Healthy young man.  On no medications.  States he has been having difficulty urinating over the last 6 months.  States that his bladder feels full, but he can only pass small amounts of urine.  No penile discharge.  No penile rash.  No dysuria.  No prior history of urinary tract, bladder, urethra, or prostate.  Does not notice it very much during the day but at night is problematic.  He states he drinks a lot of fluids during the day.  Past Medical History:  Diagnosis Date  . ADD (attention deficit disorder)   . Learning disorder     Patient Active Problem List   Diagnosis Date Noted  . Headache episodic migraines 08/07/2012  . Adjustment disorder with anxiety 08/07/2012  . Visit for preventive health examination 08/07/2012  . Tobacco use 08/07/2012  . Screening for STD (sexually transmitted disease) 05/08/2011  . Wears glasses 05/08/2011  . ACNE NEC 08/17/2007  . ADD 07/11/2007  . LEARNING DISABILITY 07/11/2007    History reviewed. No pertinent surgical history.     Home Medications    Prior to Admission medications   Medication Sig Start Date End Date Taking? Authorizing Provider  omeprazole (PRILOSEC) 20 MG capsule Take 1 capsule (20 mg total) by mouth 2 (two) times daily before a meal. 04/13/19 08/01/19  Eustace Moore, MD  simethicone (GAS-X) 80 MG chewable tablet Chew 1 tablet (80 mg total) by mouth every 6 (six) hours as needed for flatulence. 04/13/19 08/01/19  Eustace Moore, MD    Family History Family History  Problem Relation Age of Onset  . Hypertension Mother   . Sudden death Father   . Arthritis Other   . Urolithiasis Other   . Colon cancer Other        GGF  . Other Other        HD Stroke Ht in [place]    Social History  Social History   Tobacco Use  . Smoking status: Former Games developer  . Smokeless tobacco: Former Neurosurgeon    Quit date: 08/04/2013  Substance Use Topics  . Alcohol use: Yes    Comment: ocassionaly  . Drug use: Yes    Types: Marijuana    Comment: In the past     Allergies   Patient has no known allergies.   Review of Systems Review of Systems  Constitutional: Negative for chills and fever.  HENT: Negative for ear pain and sore throat.   Eyes: Negative for pain and visual disturbance.  Respiratory: Negative for cough and shortness of breath.   Cardiovascular: Negative for chest pain and palpitations.  Gastrointestinal: Negative for abdominal pain and vomiting.  Genitourinary: Positive for difficulty urinating and frequency. Negative for dysuria, flank pain and hematuria.  Musculoskeletal: Negative for arthralgias and back pain.  Skin: Negative for color change and rash.  Neurological: Negative for seizures and syncope.  All other systems reviewed and are negative.    Physical Exam Triage Vital Signs ED Triage Vitals  Enc Vitals Group     BP 11/13/19 1946 125/86     Pulse Rate 11/13/19 1946 69     Resp 11/13/19 1946 16     Temp 11/13/19 1946  98.5 F (36.9 C)     Temp Source 11/13/19 1946 Oral     SpO2 11/13/19 1946 100 %     Weight --      Height --      Head Circumference --      Peak Flow --      Pain Score 11/13/19 1948 0     Pain Loc --      Pain Edu? --      Excl. in [place]? --    No data found.  Updated Vital Signs BP 125/86 (BP Location: Right Arm)   Pulse 69   Temp 98.5 F (36.9 C) (Oral)   Resp 16   SpO2 100%  Physical Exam Constitutional:      General: He is not in acute distress.    Appearance: He is well-developed.  HENT:     Head: Normocephalic and atraumatic.     Mouth/Throat:     Comments: Mask in place Eyes:     Conjunctiva/sclera: Conjunctivae normal.     Pupils: Pupils are equal, round, and reactive to light.  Neck:     Musculoskeletal:  Normal range of motion.  Cardiovascular:     Rate and Rhythm: Normal rate.  Pulmonary:     Effort: Pulmonary effort is normal. No respiratory distress.  Abdominal:     General: There is no distension.     Palpations: Abdomen is soft.     Comments: Abdomen benign.  No CVAT  Musculoskeletal: Normal range of motion.  Skin:    General: Skin is warm and dry.  Neurological:     Mental Status: He is alert.      UC Treatments / Results  Labs (all labs ordered are listed, but only abnormal results are displayed) Labs Reviewed  POCT URINALYSIS DIP (DEVICE)    EKG   Radiology No results found.  Procedures Procedures (including critical care time)  Medications Ordered in UC Medications - No data to display  Initial Impression / Assessment and Plan / UC Course  I have reviewed the triage vital signs and the nursing notes.  Pertinent labs & imaging results that were available during my care of the patient were reviewed by me and considered in my medical decision making (see chart for details).     *Dip UA is completely normal.  I told the patient that he may have some pathology that is causing him not to empty his bladder normally, although I think it is unlikely to be an enlarged prostate.  Urinary stricture is another possibility.  I recommend follow-up with a urologist. Final Clinical Impressions(s) / UC Diagnoses   Final diagnoses:  Feeling of incomplete bladder emptying  Urination frequency     Discharge Instructions     You need evaluation by a urologist In the meantime, restrict the fluids you drink after 6 pm    ED Prescriptions    None     PDMP not reviewed this encounter.   Raylene Everts, MD 11/13/19 2025

## 2019-11-13 NOTE — ED Triage Notes (Signed)
Pt present urinary retention, pt states his bladder is full. Symptoms started last night.

## 2019-11-13 NOTE — Discharge Instructions (Addendum)
You need evaluation by a urologist In the meantime, restrict the fluids you drink after 6 pm

## 2020-02-07 DIAGNOSIS — R350 Frequency of micturition: Secondary | ICD-10-CM | POA: Diagnosis not present

## 2020-03-06 ENCOUNTER — Encounter (HOSPITAL_COMMUNITY): Payer: Self-pay | Admitting: Emergency Medicine

## 2020-03-06 ENCOUNTER — Ambulatory Visit (HOSPITAL_COMMUNITY)
Admission: EM | Admit: 2020-03-06 | Discharge: 2020-03-06 | Disposition: A | Discharge status: Home / Self Care | Payer: BC Managed Care – PPO

## 2020-03-06 ENCOUNTER — Other Ambulatory Visit: Payer: Self-pay

## 2020-03-06 DIAGNOSIS — H938X3 Other specified disorders of ear, bilateral: Secondary | ICD-10-CM | POA: Diagnosis not present

## 2020-03-06 DIAGNOSIS — J302 Other seasonal allergic rhinitis: Secondary | ICD-10-CM | POA: Diagnosis not present

## 2020-03-06 NOTE — ED Provider Notes (Signed)
Steven Porter    CSN: [id number] Arrival date & time: 03/06/20  1856      History   Chief Complaint Chief Complaint  Patient presents with  . Ear Problem    HPI Steven Porter is a 29 y.o. male.   Patient reports that he has been experiencing ear fullness today.  He reports that he feels like he has water in his right ear.  Denies ear pain, cough, runny nose, fever, headache, shortness of breath, chills, body aches, nausea, vomiting, diarrhea, rash, other symptoms.  Has not made any attempts to treat at home.  Reports that the ear fullness is only been occurring today.  Reports that he has seasonal allergies, and that he has been taking over-the-counter allergy pill, but cannot recall which one.  Denies sick contacts.  Denies sinus pain and pressure.  ROS per HPI  The history is provided by the patient.    Past Medical History:  Diagnosis Date  . ADD (attention deficit disorder)   . Learning disorder     Patient Active Problem List   Diagnosis Date Noted  . Headache episodic migraines 08/07/2012  . Adjustment disorder with anxiety 08/07/2012  . Visit for preventive health examination 08/07/2012  . Tobacco use 08/07/2012  . Screening for STD (sexually transmitted disease) 05/08/2011  . Wears glasses 05/08/2011  . ACNE NEC 08/17/2007  . ADD 07/11/2007  . LEARNING DISABILITY 07/11/2007    History reviewed. No pertinent surgical history.     Home Medications    Prior to Admission medications   Medication Sig Start Date End Date Taking? Authorizing Provider  omeprazole (PRILOSEC) 20 MG capsule Take 1 capsule (20 mg total) by mouth 2 (two) times daily before a meal. 04/13/19 08/01/19  Raylene Everts, MD  simethicone (GAS-X) 80 MG chewable tablet Chew 1 tablet (80 mg total) by mouth every 6 (six) hours as needed for flatulence. 04/13/19 08/01/19  Raylene Everts, MD    Family History Family History  Problem Relation Age of Onset  . Hypertension  Mother   . Sudden death Father   . Arthritis Other   . Urolithiasis Other   . Colon cancer Other        GGF  . Other Other        HD Stroke Ht in Gps    Social History Social History   Tobacco Use  . Smoking status: Former Research scientist (life sciences)  . Smokeless tobacco: Former Systems developer    Quit date: 08/04/2013  Substance Use Topics  . Alcohol use: Yes    Comment: ocassionaly  . Drug use: Yes    Types: Marijuana    Comment: In the past     Allergies   Patient has no known allergies.   Review of Systems Review of Systems   Physical Exam Triage Vital Signs ED Triage Vitals  Enc Vitals Group     BP 03/06/20 1929 125/69     Pulse Rate 03/06/20 1929 79     Resp 03/06/20 1929 16     Temp 03/06/20 1929 98.5 F (36.9 C)     Temp src --      SpO2 03/06/20 1929 98 %     Weight --      Height --      Head Circumference --      Peak Flow --      Pain Score 03/06/20 1927 0     Pain Loc --      Pain Edu? --  Excl. in GC? --    No data found.  Updated Vital Signs BP 125/69 (BP Location: Right Arm)   Pulse 79   Temp 98.5 F (36.9 C)   Resp 16   SpO2 98%   Visual Acuity Right Eye Distance:   Left Eye Distance:   Bilateral Distance:    Right Eye Near:   Left Eye Near:    Bilateral Near:     Physical Exam Vitals and nursing note reviewed.  Constitutional:      General: He is not in acute distress.    Appearance: Normal appearance. He is well-developed and normal weight. He is not ill-appearing.  HENT:     Head: Normocephalic and atraumatic.     Right Ear: Hearing, ear canal and external ear normal. A middle ear effusion is present. Tympanic membrane is not injected, erythematous or bulging.     Left Ear: Hearing, ear canal and external ear normal. A middle ear effusion is present. Tympanic membrane is not injected, erythematous or bulging.     Nose: Nose normal.     Mouth/Throat:     Mouth: Mucous membranes are moist.     Pharynx: Oropharynx is clear.  Eyes:      Extraocular Movements: Extraocular movements intact.     Conjunctiva/sclera: Conjunctivae normal.     Pupils: Pupils are equal, round, and reactive to light.  Cardiovascular:     Rate and Rhythm: Normal rate and regular rhythm.     Heart sounds: Normal heart sounds. No murmur.  Pulmonary:     Effort: Pulmonary effort is normal. No respiratory distress.     Breath sounds: Normal breath sounds. No stridor. No wheezing, rhonchi or rales.  Chest:     Chest wall: No tenderness.  Abdominal:     General: Bowel sounds are normal. There is no distension.     Palpations: Abdomen is soft. There is no mass.     Tenderness: There is no abdominal tenderness. There is no guarding or rebound.     Hernia: No hernia is present.  Musculoskeletal:        General: Normal range of motion.     Cervical back: Normal range of motion and neck supple. No tenderness.  Lymphadenopathy:     Cervical: No cervical adenopathy.  Skin:    General: Skin is warm and dry.     Capillary Refill: Capillary refill takes less than 2 seconds.  Neurological:     General: No focal deficit present.     Mental Status: He is alert and oriented to person, place, and time.  Psychiatric:        Mood and Affect: Mood normal.        Behavior: Behavior normal.        Thought Content: Thought content normal.      UC Treatments / Results  Labs (all labs ordered are listed, but only abnormal results are displayed) Labs Reviewed - No data to display  EKG   Radiology No results found.  Procedures Procedures (including critical care time)  Medications Ordered in UC Medications - No data to display  Initial Impression / Assessment and Plan / UC Course  I have reviewed the triage vital signs and the nursing notes.  Pertinent labs & imaging results that were available during my care of the patient were reviewed by me and considered in my medical decision making (see chart for details).     Patient presents with ear  fullness since this morning.  Has been taking over-the-counter allergy pill, cannot recall which one.  Instructed patient he can continue taking an over-the-counter allergy medication, as well as Mucinex to help with the fullness feeling in his ears.  Instructed to follow-up with this office or with primary care for fever, sinus pain or pressure, ear pain, as needed.  Instructed patient to follow-up with the ER if experiencing trouble swallowing, trouble breathing, high fever, other concerning symptoms. Final Clinical Impressions(s) / UC Diagnoses   Final diagnoses:  Ear fullness, bilateral  Seasonal allergies     Discharge Instructions     You are experiencing a lingering affect of seasonal allergies. Continue taking over the counter allergy medication for about the next 2 weeks.   You may also use Mucinex or other over the counter decongestant.   If you are experiencing worsening symptoms with ear pain, fever, congestion, sinus pain and pressure, follow up in this office or with primary care.     ED Prescriptions    None     PDMP not reviewed this encounter.   Moshe Cipro, NP 03/06/20 1951

## 2020-03-06 NOTE — Discharge Instructions (Addendum)
You are experiencing a lingering affect of seasonal allergies. Continue taking over the counter allergy medication for about the next 2 weeks.   You may also use Mucinex or other over the counter decongestant.   If you are experiencing worsening symptoms with ear pain, fever, congestion, sinus pain and pressure, follow up in this office or with primary care.

## 2020-03-06 NOTE — ED Triage Notes (Signed)
Right ear feels like pressure or water is in ear, no pain.  Ear issue started "sometime today"  cold or runny nose, reports slight cough with allergies

## 2020-03-18 DIAGNOSIS — N401 Enlarged prostate with lower urinary tract symptoms: Secondary | ICD-10-CM | POA: Diagnosis not present

## 2020-03-18 DIAGNOSIS — R351 Nocturia: Secondary | ICD-10-CM | POA: Diagnosis not present

## 2020-04-19 DIAGNOSIS — R351 Nocturia: Secondary | ICD-10-CM | POA: Diagnosis not present

## 2020-04-19 DIAGNOSIS — N401 Enlarged prostate with lower urinary tract symptoms: Secondary | ICD-10-CM | POA: Diagnosis not present

## 2020-07-06 DIAGNOSIS — K4090 Unilateral inguinal hernia, without obstruction or gangrene, not specified as recurrent: Secondary | ICD-10-CM | POA: Diagnosis not present

## 2020-10-01 DIAGNOSIS — Z20822 Contact with and (suspected) exposure to covid-19: Secondary | ICD-10-CM | POA: Diagnosis not present

## 2020-10-07 DIAGNOSIS — Z20822 Contact with and (suspected) exposure to covid-19: Secondary | ICD-10-CM | POA: Diagnosis not present

## 2020-10-08 DIAGNOSIS — U071 COVID-19: Secondary | ICD-10-CM | POA: Diagnosis not present

## 2020-11-13 DIAGNOSIS — R609 Edema, unspecified: Secondary | ICD-10-CM | POA: Diagnosis not present

## 2020-11-13 DIAGNOSIS — K4090 Unilateral inguinal hernia, without obstruction or gangrene, not specified as recurrent: Secondary | ICD-10-CM | POA: Diagnosis not present

## 2020-12-25 DIAGNOSIS — K4020 Bilateral inguinal hernia, without obstruction or gangrene, not specified as recurrent: Secondary | ICD-10-CM | POA: Diagnosis not present

## 2021-02-04 DIAGNOSIS — Z01818 Encounter for other preprocedural examination: Secondary | ICD-10-CM | POA: Diagnosis not present

## 2021-02-07 DIAGNOSIS — K4020 Bilateral inguinal hernia, without obstruction or gangrene, not specified as recurrent: Secondary | ICD-10-CM | POA: Diagnosis not present

## 2022-06-08 ENCOUNTER — Emergency Department (HOSPITAL_BASED_OUTPATIENT_CLINIC_OR_DEPARTMENT_OTHER)
Admission: EM | Admit: 2022-06-08 | Discharge: 2022-06-08 | Disposition: A | Discharge status: Other Acute Inpatient Hospital | Payer: BC Managed Care – PPO | Attending: Emergency Medicine | Admitting: Emergency Medicine

## 2022-06-08 ENCOUNTER — Other Ambulatory Visit: Payer: Self-pay

## 2022-06-08 ENCOUNTER — Emergency Department (HOSPITAL_BASED_OUTPATIENT_CLINIC_OR_DEPARTMENT_OTHER): Payer: BC Managed Care – PPO

## 2022-06-08 ENCOUNTER — Encounter (HOSPITAL_BASED_OUTPATIENT_CLINIC_OR_DEPARTMENT_OTHER): Payer: Self-pay

## 2022-06-08 DIAGNOSIS — M79671 Pain in right foot: Secondary | ICD-10-CM | POA: Diagnosis not present

## 2022-06-08 DIAGNOSIS — M25571 Pain in right ankle and joints of right foot: Secondary | ICD-10-CM | POA: Insufficient documentation

## 2022-06-08 DIAGNOSIS — Y9302 Activity, running: Secondary | ICD-10-CM | POA: Insufficient documentation

## 2022-06-08 MED ORDER — NAPROXEN 375 MG PO TABS: 375.0000 mg | ORAL_TABLET | Freq: Two times a day (BID) | ORAL | 0 refills | Status: DC

## 2022-06-08 NOTE — ED Triage Notes (Signed)
C/o right ankle pain starting 3 hours ago. Denies known injury.

## 2022-06-08 NOTE — ED Provider Notes (Signed)
[facility] EMERGENCY DEPARTMENT Provider Note   CSN: [id number] Arrival date & time: 06/08/22  1751     History  Chief Complaint  Patient presents with   Ankle Pain    Steven Porter is a 31 y.o. male who presents emergency department chief complaint of dorsal foot pain.  Patient states that his pain started today.  He does not remember any specific injuries except for that he kicked a bottle of detergent and out of the way the other day however he did not have any pain doing this.  He does walk a lot for work he denies recently changing his shoes, increase in physical activity such as running.  Pain is worse on the dorsum of his midfoot, worse with plantar and dorsiflexion, better with rest.  He has not tried anything prior to coming to the emergency department.  No heat redness swelling or history of gout.   Ankle Pain      Home Medications Prior to Admission medications   Medication Sig Start Date End Date Taking? Authorizing Provider  naproxen (NAPROSYN) 375 MG tablet Take 1 tablet (375 mg total) by mouth 2 (two) times daily with a meal. 06/08/22  Yes Arthor Captain, PA-C  omeprazole (PRILOSEC) 20 MG capsule Take 1 capsule (20 mg total) by mouth 2 (two) times daily before a meal. 04/13/19 08/01/19  Eustace Moore, MD  simethicone (GAS-X) 80 MG chewable tablet Chew 1 tablet (80 mg total) by mouth every 6 (six) hours as needed for flatulence. 04/13/19 08/01/19  Eustace Moore, MD      Allergies    Patient has no known allergies.    Review of Systems   Review of Systems  Physical Exam Updated Vital Signs BP 135/79   Pulse 86   Temp 98.4 F (36.9 C) (Oral)   Resp 14   Ht 6' (1.829 m)   Wt 72.1 kg   SpO2 99%   BMI 21.56 kg/m  Physical Exam Vitals and nursing note reviewed.  Constitutional:      General: He is not in acute distress.    Appearance: He is well-developed. He is not diaphoretic.  HENT:     Head: Normocephalic and atraumatic.  Eyes:      General: No scleral icterus.    Conjunctiva/sclera: Conjunctivae normal.  Cardiovascular:     Rate and Rhythm: Normal rate and regular rhythm.     Heart sounds: Normal heart sounds.  Pulmonary:     Effort: Pulmonary effort is normal. No respiratory distress.     Breath sounds: Normal breath sounds.  Abdominal:     Palpations: Abdomen is soft.     Tenderness: There is no abdominal tenderness.  Musculoskeletal:     Cervical back: Normal range of motion and neck supple.     Comments: Tenderness to palpation of the dorsum of the right foot, pain is worse with passive extension.  Full range of motion, no redness heat or swelling, DP PT pulse 2+, normal ipsilateral knee examination  Skin:    General: Skin is warm and dry.  Neurological:     Mental Status: He is alert.  Psychiatric:        Behavior: Behavior normal.     ED Results / Procedures / Treatments   Labs (all labs ordered are listed, but only abnormal results are displayed) Labs Reviewed - No data to display  EKG None  Radiology DG Ankle Complete Right  Result Date: 06/08/2022 CLINICAL DATA:  ankle  pain EXAM: RIGHT ANKLE - COMPLETE 3+ VIEW COMPARISON:  None Available. FINDINGS: There is no evidence of fracture, dislocation, or joint effusion. There is no evidence of arthropathy or other focal bone abnormality. Soft tissues are unremarkable. IMPRESSION: Negative. Electronically Signed   By: Tish Frederickson M.D.   On: 06/08/2022 19:02    Procedures Procedures    Medications Ordered in ED Medications - No data to display  ED Course/ Medical Decision Making/ A&P                           Medical Decision Making 31 year old male who presents with acute onset foot pain, no known injuries.  I suspect a tendinitis.  I have reviewed and visualized images of the right ankle x-ray which shows no acute findings.  Patient has full range of motion.  Placed in ASO ankle splint to decrease range of motion and provide ankle support,  discussed RICE and outpatient therapy.  Referral given to podiatry.  Naproxen at discharge.  Discussed outpatient follow-up and return precautions  Amount and/or Complexity of Data Reviewed Radiology: ordered and independent interpretation performed.  Risk Prescription drug management.           Final Clinical Impression(s) / ED Diagnoses Final diagnoses:  Right foot pain    Rx / DC Orders ED Discharge Orders          Ordered    naproxen (NAPROSYN) 375 MG tablet  2 times daily with meals        06/08/22 1955              Arthor Captain, PA-C 06/08/22 1959    Maia Plan, MD 06/09/22 1035

## 2022-06-08 NOTE — Discharge Instructions (Addendum)
Contact a health care provider if: Your symptoms do not improve. You develop new, unexplained problems, such as numbness in your hands or feet. 

## 2023-04-14 ENCOUNTER — Ambulatory Visit (HOSPITAL_COMMUNITY)
Admission: EM | Admit: 2023-04-14 | Discharge: 2023-04-14 | Disposition: A | Discharge status: Home / Self Care | Payer: BC Managed Care – PPO | Attending: Emergency Medicine | Admitting: Emergency Medicine

## 2023-04-14 ENCOUNTER — Other Ambulatory Visit: Payer: Self-pay

## 2023-04-14 ENCOUNTER — Encounter (HOSPITAL_COMMUNITY): Payer: Self-pay | Admitting: Emergency Medicine

## 2023-04-14 DIAGNOSIS — M5441 Lumbago with sciatica, right side: Secondary | ICD-10-CM

## 2023-04-14 DIAGNOSIS — M5442 Lumbago with sciatica, left side: Secondary | ICD-10-CM

## 2023-04-14 MED ORDER — TIZANIDINE HCL 4 MG PO CAPS: 4.0000 mg | ORAL_CAPSULE | Freq: Three times a day (TID) | ORAL | 0 refills | Status: AC | PRN

## 2023-04-14 MED ORDER — KETOROLAC TROMETHAMINE 30 MG/ML IJ SOLN
INTRAMUSCULAR | Status: AC
  Filled 2023-04-14: qty 1

## 2023-04-14 MED ORDER — KETOROLAC TROMETHAMINE 30 MG/ML IJ SOLN
30.0000 mg | Freq: Once | INTRAMUSCULAR | Status: AC
  Administered 2023-04-14: 30 mg via INTRAMUSCULAR

## 2023-04-14 MED ORDER — IBUPROFEN 800 MG PO TABS: 800.0000 mg | ORAL_TABLET | Freq: Three times a day (TID) | ORAL | 0 refills | Status: AC

## 2023-04-14 MED ORDER — LIDOCAINE 5 % EX PTCH: 1.0000 | MEDICATED_PATCH | CUTANEOUS | 0 refills | Status: AC

## 2023-04-14 NOTE — ED Triage Notes (Signed)
Lower back pain.  No known injury.  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  Reported shocking pain into buttocks.  No bladder or bowel issues  Has taken ibuprofen yesterday.

## 2023-04-14 NOTE — ED Notes (Signed)
Reviewed work note 

## 2023-04-14 NOTE — ED Provider Notes (Signed)
[facility]    CSN: [id number] Arrival date & time: 04/14/23  4098     History   Chief Complaint Chief Complaint  Patient presents with   Back Pain    HPI Steven Porter is a 32 y.o. male.  Yesterday developed low back pain bilaterally With lifting legs and bending forwards he feels it shoot into the upper buttocks. Does not radiate all the way down the legs. Pain is 8/10. Tried ibuprofen without relief. No meds today.  Denies any injury or trauma.  He sits a lot for work and does heavy lifting. No bladder or bowel dysfunction.  Denies weakness, numbness or tingling of the extremities. No fevers No history of this  Past Medical History:  Diagnosis Date   ADD (attention deficit disorder)    Learning disorder     Patient Active Problem List   Diagnosis Date Noted   Headache episodic migraines 08/07/2012   Adjustment disorder with anxiety 08/07/2012   Visit for preventive health examination 08/07/2012   Tobacco use 08/07/2012   Screening for STD (sexually transmitted disease) 05/08/2011   Wears glasses 05/08/2011   ACNE NEC 08/17/2007   ADD 07/11/2007   LEARNING DISABILITY 07/11/2007    History reviewed. No pertinent surgical history.   Home Medications    Prior to Admission medications   Medication Sig Start Date End Date Taking? Authorizing Provider  ibuprofen (ADVIL) 800 MG tablet Take 1 tablet (800 mg total) by mouth 3 (three) times daily. 04/14/23  Yes Natali Lavallee, PA-C  lidocaine (LIDODERM) 5 % Place 1 patch onto the skin daily. Remove & Discard patch within 12 hours 04/14/23  Yes Lenae Wherley, Lurena Joiner, PA-C  tiZANidine (ZANAFLEX) 4 MG capsule Take 1 capsule (4 mg total) by mouth 3 (three) times daily as needed for muscle spasms. 04/14/23  Yes Reinhart Saulters, Lurena Joiner, PA-C  omeprazole (PRILOSEC) 20 MG capsule Take 1 capsule (20 mg total) by mouth 2 (two) times daily before a meal. 04/13/19 08/01/19  Eustace Moore, MD  simethicone (GAS-X) 80 MG chewable tablet  Chew 1 tablet (80 mg total) by mouth every 6 (six) hours as needed for flatulence. 04/13/19 08/01/19  Eustace Moore, MD    Family History Family History  Problem Relation Age of Onset   Hypertension Mother    Sudden death Father    Arthritis Other    Urolithiasis Other    Colon cancer Other        GGF   Other Other        HD Stroke Ht in Gps    Social History Social History   Tobacco Use   Smoking status: Former   Smokeless tobacco: Former    Quit date: 08/04/2013  Vaping Use   Vaping Use: Every day  Substance Use Topics   Alcohol use: Yes    Comment: ocassionaly   Drug use: Yes    Types: Marijuana    Comment: In the past     Allergies   Patient has no known allergies.   Review of Systems Review of Systems As per HPI  Physical Exam Triage Vital Signs ED Triage Vitals  Enc Vitals Group     BP 04/14/23 0933 126/86     Pulse Rate 04/14/23 0933 77     Resp 04/14/23 0933 18     Temp 04/14/23 0933 98 F (36.7 C)     Temp Source 04/14/23 0933 Oral     SpO2 04/14/23 0933 99 %  Weight --      Height --      Head Circumference --      Peak Flow --      Pain Score 04/14/23 0931 8     Pain Loc --      Pain Edu? --      Excl. in GC? --    No data found.  Updated Vital Signs BP 126/86 (BP Location: Left Arm)   Pulse 77   Temp 98 F (36.7 C) (Oral)   Resp 18   SpO2 99%    Physical Exam Vitals and nursing note reviewed.  Constitutional:      General: He is not in acute distress.    Comments: Appears uncomfortable, sitting on edge of exam table  HENT:     Nose: Nose normal.     Mouth/Throat:     Mouth: Mucous membranes are moist.     Pharynx: Oropharynx is clear.  Eyes:     Extraocular Movements: Extraocular movements intact.     Conjunctiva/sclera: Conjunctivae normal.     Pupils: Pupils are equal, round, and reactive to light.  Cardiovascular:     Rate and Rhythm: Normal rate and regular rhythm.     Heart sounds: Normal heart sounds.   Pulmonary:     Effort: Pulmonary effort is normal.     Breath sounds: Normal breath sounds.  Abdominal:     Palpations: Abdomen is soft.     Tenderness: There is no abdominal tenderness.  Musculoskeletal:        General: No tenderness, deformity or signs of injury.     Cervical back: Normal range of motion. No rigidity.     Lumbar back: Positive right straight leg raise test and positive left straight leg raise test.     Right lower leg: No edema.     Left lower leg: No edema.     Comments: No spinal tenderness C-L spine. No muscle tenderness or tightness of the back. +SLR bilaterally, more prominent on Right.   Skin:    General: Skin is warm and dry.     Findings: No bruising or rash.  Neurological:     General: No focal deficit present.     Mental Status: He is alert and oriented to person, place, and time.     Cranial Nerves: No cranial nerve deficit.     Sensory: Sensation is intact. No sensory deficit.     Motor: Motor function is intact. No weakness.     Coordination: Coordination is intact.     Comments: Strength 5/5 all extremities     UC Treatments / Results  Labs (all labs ordered are listed, but only abnormal results are displayed) Labs Reviewed - No data to display  EKG  Radiology No results found.  Procedures Procedures  Medications Ordered in UC Medications  ketorolac (TORADOL) 30 MG/ML injection 30 mg (30 mg Intramuscular Given 04/14/23 1008)    Initial Impression / Assessment and Plan / UC Course  I have reviewed the triage vital signs and the nursing notes.  Pertinent labs & imaging results that were available during my care of the patient were reviewed by me and considered in my medical decision making (see chart for details).  Bilat low back pain. Sciatica, worse on right Toradol IM given in clinic No red flags, stable vitals, neuro exam intact Discussed pain control with tylenol, waiting 12 hours to take any NSAIDs, muscle relaxer TID prn. Work  note provided. Return and  ED precautions discussed. Patient agreeable to plan   Final Clinical Impressions(s) / UC Diagnoses   Final diagnoses:  Acute bilateral low back pain with bilateral sciatica     Discharge Instructions      The injection given today should provide good pain relief. You can use tylenol today if needed. Ibuprofen can be taken every 6 hours as needed, starting tomorrow. You can take the muscle relaxer three times daily. If the medication makes you drowsy, take only one at bed time. Lidocaine patch can also be used for 12 hours at a time.  Try these interventions for the next several days to a week. If symptoms persist or worsen, please return     ED Prescriptions     Medication Sig Dispense Auth. Provider   ibuprofen (ADVIL) 800 MG tablet Take 1 tablet (800 mg total) by mouth 3 (three) times daily. 21 tablet Randa Riss, PA-C   lidocaine (LIDODERM) 5 % Place 1 patch onto the skin daily. Remove & Discard patch within 12 hours 14 patch Zitlali Primm, PA-C   tiZANidine (ZANAFLEX) 4 MG capsule Take 1 capsule (4 mg total) by mouth 3 (three) times daily as needed for muscle spasms. 20 capsule Khai Torbert, Lurena Joiner, PA-C      PDMP not reviewed this encounter.   Marlow Baars, [place] 04/14/23 1105

## 2023-04-14 NOTE — Discharge Instructions (Signed)
The injection given today should provide good pain relief. You can use tylenol today if needed. Ibuprofen can be taken every 6 hours as needed, starting tomorrow. You can take the muscle relaxer three times daily. If the medication makes you drowsy, take only one at bed time. Lidocaine patch can also be used for 12 hours at a time.  Try these interventions for the next several days to a week. If symptoms persist or worsen, please return

## 2023-10-26 ENCOUNTER — Emergency Department (HOSPITAL_BASED_OUTPATIENT_CLINIC_OR_DEPARTMENT_OTHER): Payer: Self-pay

## 2023-10-26 ENCOUNTER — Emergency Department (HOSPITAL_BASED_OUTPATIENT_CLINIC_OR_DEPARTMENT_OTHER)
Admission: EM | Admit: 2023-10-26 | Discharge: 2023-10-26 | Disposition: A | Discharge status: Home / Self Care | Payer: Self-pay | Attending: Emergency Medicine | Admitting: Emergency Medicine

## 2023-10-26 ENCOUNTER — Encounter (HOSPITAL_BASED_OUTPATIENT_CLINIC_OR_DEPARTMENT_OTHER): Payer: Self-pay

## 2023-10-26 ENCOUNTER — Other Ambulatory Visit: Payer: Self-pay

## 2023-10-26 DIAGNOSIS — Z789 Other specified health status: Secondary | ICD-10-CM

## 2023-10-26 DIAGNOSIS — F1090 Alcohol use, unspecified, uncomplicated: Secondary | ICD-10-CM | POA: Insufficient documentation

## 2023-10-26 DIAGNOSIS — R45851 Suicidal ideations: Secondary | ICD-10-CM | POA: Insufficient documentation

## 2023-10-26 DIAGNOSIS — Z23 Encounter for immunization: Secondary | ICD-10-CM | POA: Insufficient documentation

## 2023-10-26 DIAGNOSIS — S0181XA Laceration without foreign body of other part of head, initial encounter: Secondary | ICD-10-CM | POA: Insufficient documentation

## 2023-10-26 DIAGNOSIS — W25XXXA Contact with sharp glass, initial encounter: Secondary | ICD-10-CM | POA: Insufficient documentation

## 2023-10-26 LAB — COMPREHENSIVE METABOLIC PANEL
ALT: 11 U/L (ref 0–44)
AST: 15 U/L (ref 15–41)
Albumin: 4.6 g/dL (ref 3.5–5.0)
Alkaline Phosphatase: 62 U/L (ref 38–126)
Anion gap: 10 (ref 5–15)
BUN: 11 mg/dL (ref 6–20)
CO2: 23 mmol/L (ref 22–32)
Calcium: 9.7 mg/dL (ref 8.9–10.3)
Chloride: 108 mmol/L (ref 98–111)
Creatinine, Ser: 0.96 mg/dL (ref 0.61–1.24)
GFR, Estimated: >60 mL/min (ref >60)
Glucose, Bld: 99 mg/dL (ref 70–99)
Potassium: 4 mmol/L (ref 3.5–5.1)
Sodium: 141 mmol/L (ref 135–145)
Total Bilirubin: 0.9 mg/dL (ref <1.2)
Total Protein: 7.4 g/dL (ref 6.5–8.1)

## 2023-10-26 LAB — CBC WITH DIFFERENTIAL/PLATELET
Abs Immature Granulocytes: 0.02 10*3/uL (ref 0.00–0.07)
Basophils Absolute: 0.1 10*3/uL (ref 0.0–0.1)
Basophils Relative: 1 %
Eosinophils Absolute: 0 10*3/uL (ref 0.0–0.5)
Eosinophils Relative: 0 %
HCT: 44.6 % (ref 39.0–52.0)
Hemoglobin: 15.1 g/dL (ref 13.0–17.0)
Immature Granulocytes: 0 %
Lymphocytes Relative: 20 %
Lymphs Abs: 1.7 10*3/uL (ref 0.7–4.0)
MCH: 27.7 pg (ref 26.0–34.0)
MCHC: 33.9 g/dL (ref 30.0–36.0)
MCV: 81.8 fL (ref 80.0–100.0)
Monocytes Absolute: 0.3 10*3/uL (ref 0.1–1.0)
Monocytes Relative: 3 %
Neutro Abs: 6.6 10*3/uL (ref 1.7–7.7)
Neutrophils Relative %: 76 %
Platelets: 237 10*3/uL (ref 150–400)
RBC: 5.45 MIL/uL (ref 4.22–5.81)
RDW: 12.1 % (ref 11.5–15.5)
WBC: 8.7 10*3/uL (ref 4.0–10.5)
nRBC: 0 % (ref 0.0–0.2)

## 2023-10-26 LAB — SALICYLATE LEVEL: Salicylate Lvl: <7 mg/dL — ABNORMAL LOW (ref 7.0–30.0)

## 2023-10-26 LAB — ETHANOL: Alcohol, Ethyl (B): 101 mg/dL — ABNORMAL HIGH (ref <10)

## 2023-10-26 LAB — ACETAMINOPHEN LEVEL: Acetaminophen (Tylenol), Serum: <10 ug/mL — ABNORMAL LOW (ref 10–30)

## 2023-10-26 MED ORDER — LIDOCAINE-EPINEPHRINE (PF) 2 %-1:200000 IJ SOLN
10.0000 mL | Freq: Once | INTRAMUSCULAR | Status: AC
  Administered 2023-10-26: 10 mL
  Filled 2023-10-26: qty 20

## 2023-10-26 MED ORDER — TETANUS-DIPHTH-ACELL PERTUSSIS 5-2.5-18.5 LF-MCG/0.5 IM SUSY
0.5000 mL | PREFILLED_SYRINGE | Freq: Once | INTRAMUSCULAR | Status: AC
  Administered 2023-10-26: 0.5 mL via INTRAMUSCULAR
  Filled 2023-10-26: qty 0.5

## 2023-10-26 NOTE — ED Notes (Signed)
Reviewed AVS with patient, patient expressed understanding of directions, denies further questions at this time. Patient awaiting grandmother to pick him up.
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
## 2023-11-03 ENCOUNTER — Ambulatory Visit (HOSPITAL_COMMUNITY)
Admission: EM | Admit: 2023-11-03 | Discharge: 2023-11-03 | Disposition: A | Discharge status: Home / Self Care | Payer: Self-pay | Attending: Emergency Medicine | Admitting: Emergency Medicine

## 2023-11-03 ENCOUNTER — Encounter (HOSPITAL_COMMUNITY): Payer: Self-pay

## 2023-11-03 DIAGNOSIS — Z4802 Encounter for removal of sutures: Secondary | ICD-10-CM

## 2023-11-03 NOTE — ED Provider Notes (Signed)
[facility]    CSN: [id number] Arrival date & time: 11/03/23  1608      History   Chief Complaint Chief Complaint  Patient presents with   Suture / Staple Removal    HPI Steven Porter is a 32 y.o. male.   Patient presents to clinic for suture removal.  He had 5 5-0 sutures placed to his forehead in the ED on 11/19. Denies any pain, drainage or issues. The area has been itchy.   The history is provided by the patient and medical records.  Suture / Staple Removal    Past Medical History:  Diagnosis Date   ADD (attention deficit disorder)    Learning disorder     Patient Active Problem List   Diagnosis Date Noted   Headache 08/07/2012   Adjustment disorder with anxiety 08/07/2012   Visit for preventive health examination 08/07/2012   Tobacco use 08/07/2012   Screening for STD (sexually transmitted disease) 05/08/2011   Wears glasses 05/08/2011   ACNE NEC 08/17/2007   Attention deficit disorder 07/11/2007   LEARNING DISABILITY 07/11/2007    History reviewed. No pertinent surgical history.     Home Medications    Prior to Admission medications   Medication Sig Start Date End Date Taking? Authorizing Provider  ibuprofen (ADVIL) 800 MG tablet Take 1 tablet (800 mg total) by mouth 3 (three) times daily. 04/14/23   Rising, Rebecca, PA-C  lidocaine (LIDODERM) 5 % Place 1 patch onto the skin daily. Remove & Discard patch within 12 hours 04/14/23   Rising, Lurena Joiner, PA-C  tiZANidine (ZANAFLEX) 4 MG capsule Take 1 capsule (4 mg total) by mouth 3 (three) times daily as needed for muscle spasms. 04/14/23   Rising, Lurena Joiner, PA-C  omeprazole (PRILOSEC) 20 MG capsule Take 1 capsule (20 mg total) by mouth 2 (two) times daily before a meal. 04/13/19 08/01/19  Eustace Moore, MD  simethicone (GAS-X) 80 MG chewable tablet Chew 1 tablet (80 mg total) by mouth every 6 (six) hours as needed for flatulence. 04/13/19 08/01/19  Eustace Moore, MD    Family History Family  History  Problem Relation Age of Onset   Hypertension Mother    Sudden death Father    Arthritis Other    Urolithiasis Other    Colon cancer Other        GGF   Other Other        HD Stroke Ht in Gps    Social History Social History   Tobacco Use   Smoking status: Former   Smokeless tobacco: Former    Quit date: 08/04/2013  Vaping Use   Vaping status: Every Day  Substance Use Topics   Alcohol use: Yes    Comment: ocassionaly   Drug use: Yes    Types: Marijuana    Comment: In the past     Allergies   Patient has no known allergies.   Review of Systems Review of Systems   Physical Exam Triage Vital Signs ED Triage Vitals [11/03/23 1629]  Encounter Vitals Group     BP 137/76     Systolic BP Percentile      Diastolic BP Percentile      Pulse Rate 71     Resp 18     Temp 98.6 F (37 C)     Temp Source Oral     SpO2 97 %     Weight      Height      Head Circumference  Peak Flow      Pain Score      Pain Loc      Pain Education      Exclude from Growth Chart    No data found.  Updated Vital Signs BP 137/76 (BP Location: Left Arm)   Pulse 71   Temp 98.6 F (37 C) (Oral)   Resp 18   SpO2 97%   Visual Acuity Right Eye Distance:   Left Eye Distance:   Bilateral Distance:    Right Eye Near:   Left Eye Near:    Bilateral Near:     Physical Exam Vitals and nursing note reviewed.  Constitutional:      Appearance: Normal appearance.  HENT:     Head: Normocephalic.     Right Ear: External ear normal.     Left Ear: External ear normal.     Nose: Nose normal.     Mouth/Throat:     Mouth: Mucous membranes are moist.  Cardiovascular:     Rate and Rhythm: Normal rate.  Pulmonary:     Effort: Pulmonary effort is normal. No respiratory distress.  Musculoskeletal:        General: Normal range of motion.  Skin:    General: Skin is warm and dry.  Neurological:     General: No focal deficit present.     Mental Status: He is alert.   Psychiatric:        Mood and Affect: Mood normal.      UC Treatments / Results  Labs (all labs ordered are listed, but only abnormal results are displayed) Labs Reviewed - No data to display  EKG   Radiology No results found.  Procedures Procedures (including critical care time)  Medications Ordered in UC Medications - No data to display  Initial Impression / Assessment and Plan / UC Course  I have reviewed the triage vital signs and the nursing notes.  Pertinent labs & imaging results that were available during my care of the patient were reviewed by me and considered in my medical decision making (see chart for details).  Vitals and triage reviewed, patient is hemodynamically stable. Five 5-0 sutures to anterior forehead removed, edges well-approximated.  No complications.  No acute infection, appears to be well-healed.  Plan of care, follow-up care return precautions given, no questions at this time.     Final Clinical Impressions(s) / UC Diagnoses   Final diagnoses:  Visit for suture removal   Discharge Instructions   None    ED Prescriptions   None    PDMP not reviewed this encounter.   Aldrin Engelhard, [place], [place] 11/03/23 [phone]

## 2023-11-03 NOTE — ED Triage Notes (Signed)
Pt  presents to the office for suture removal to his forehead. Denies any odor or drainage.

## 2024-08-12 ENCOUNTER — Encounter (HOSPITAL_COMMUNITY): Payer: Self-pay

## 2024-08-12 ENCOUNTER — Ambulatory Visit (HOSPITAL_COMMUNITY)
Admission: RE | Admit: 2024-08-12 | Discharge: 2024-08-12 | Disposition: A | Discharge status: Home / Self Care | Payer: Self-pay | Source: Ambulatory Visit | Attending: Physician Assistant | Admitting: Physician Assistant

## 2024-08-12 VITALS — BP 114/75 | HR 60 | Temp 98.1°F | Resp 18

## 2024-08-12 DIAGNOSIS — R051 Acute cough: Secondary | ICD-10-CM

## 2024-08-12 DIAGNOSIS — J019 Acute sinusitis, unspecified: Secondary | ICD-10-CM

## 2024-08-12 MED ORDER — PROMETHAZINE-DM 6.25-15 MG/5ML PO SYRP: 5.0000 mL | ORAL_SOLUTION | Freq: Four times a day (QID) | ORAL | 0 refills | Status: AC | PRN

## 2024-08-12 MED ORDER — AMOXICILLIN-POT CLAVULANATE 875-125 MG PO TABS: 1.0000 | ORAL_TABLET | Freq: Two times a day (BID) | ORAL | 0 refills | Status: AC

## 2024-08-12 NOTE — ED Triage Notes (Signed)
 Patient states he is coughing up yellow mucous x 1 week. Patient states he had a fever last week. Patient has not taken any medication for his symptoms.

## 2024-08-12 NOTE — Discharge Instructions (Signed)
 Take antibiotic as prescribed. Can take cough syrup as needed for cough. Can use Flonase for congestion and ear pressure. Drink plenty of fluids. Return if you have new or worsening symptoms.

## 2024-08-12 NOTE — ED Provider Notes (Signed)
 [facility]    CSN: [id number] Arrival date & time: 08/12/24  1138      History   Chief Complaint Chief Complaint  Patient presents with   Cough    Was sick not any more a dry lingering cough - Entered by patient    HPI Steven Porter is a 33 y.o. male.   Patient presents with 1 week of productive cough with yellow mucus.  He reports last week he was sick with fever, body aches.  He reports fever has now resolved and he is feeling somewhat better but cough has persisted.  He has taken TheraFlu with minimal relief.  Denies shortness of breath or wheezing.    Past Medical History:  Diagnosis Date   ADD (attention deficit disorder)    Learning disorder     Patient Active Problem List   Diagnosis Date Noted   Headache 08/07/2012   Adjustment disorder with anxiety 08/07/2012   Visit for preventive health examination 08/07/2012   Tobacco use 08/07/2012   Screening for STD (sexually transmitted disease) 05/08/2011   Wears glasses 05/08/2011   ACNE NEC 08/17/2007   Attention deficit disorder 07/11/2007   LEARNING DISABILITY 07/11/2007    History reviewed. No pertinent surgical history.     Home Medications    Prior to Admission medications   Medication Sig Start Date End Date Taking? Authorizing Provider  amoxicillin -clavulanate (AUGMENTIN ) 875-125 MG tablet Take 1 tablet by mouth every 12 (twelve) hours. 08/12/24  Yes Ward, Harlene PEDLAR, PA-C  promethazine -dextromethorphan (PROMETHAZINE -DM) 6.25-15 MG/5ML syrup Take 5 mLs by mouth 4 (four) times daily as needed for cough. 08/12/24  Yes Ward, Harlene PEDLAR, PA-C  ibuprofen  (ADVIL ) 800 MG tablet Take 1 tablet (800 mg total) by mouth 3 (three) times daily. 04/14/23   Rising, Asberry, PA-C  lidocaine  (LIDODERM ) 5 % Place 1 patch onto the skin daily. Remove & Discard patch within 12 hours 04/14/23   Rising, Asberry, PA-C  tiZANidine  (ZANAFLEX ) 4 MG capsule Take 1 capsule (4 mg total) by mouth 3 (three) times daily as needed  for muscle spasms. 04/14/23   Rising, Asberry, PA-C  omeprazole  (PRILOSEC) 20 MG capsule Take 1 capsule (20 mg total) by mouth 2 (two) times daily before a meal. 04/13/19 08/01/19  Maranda Jamee Jacob, MD  simethicone  (GAS-X) 80 MG chewable tablet Chew 1 tablet (80 mg total) by mouth every 6 (six) hours as needed for flatulence. 04/13/19 08/01/19  Maranda Jamee Jacob, MD    Family History Family History  Problem Relation Age of Onset   Hypertension Mother    Sudden death Father    Arthritis Other    Urolithiasis Other    Colon cancer Other        GGF   Other Other        HD Stroke Ht in Gps    Social History Social History   Tobacco Use   Smoking status: Former   Smokeless tobacco: Former    Quit date: 08/04/2013  Vaping Use   Vaping status: Every Day  Substance Use Topics   Alcohol use: Yes    Comment: ocassionaly   Drug use: Yes    Types: Marijuana    Comment: In the past     Allergies   Patient has no known allergies.   Review of Systems Review of Systems  Constitutional:  Negative for chills and fever.  HENT:  Positive for congestion. Negative for ear pain and sore throat.   Eyes:  Negative  for pain and visual disturbance.  Respiratory:  Positive for cough. Negative for shortness of breath.   Cardiovascular:  Negative for chest pain and palpitations.  Gastrointestinal:  Negative for abdominal pain and vomiting.  Genitourinary:  Negative for dysuria and hematuria.  Musculoskeletal:  Negative for arthralgias and back pain.  Skin:  Negative for color change and rash.  Neurological:  Negative for seizures and syncope.  All other systems reviewed and are negative.    Physical Exam Triage Vital Signs ED Triage Vitals [08/12/24 1251]  Encounter Vitals Group     BP 114/75     Girls Systolic BP Percentile      Girls Diastolic BP Percentile      Boys Systolic BP Percentile      Boys Diastolic BP Percentile      Pulse Rate 60     Resp 18     Temp 98.1 F (36.7 C)      Temp Source Oral     SpO2 98 %     Weight      Height      Head Circumference      Peak Flow      Pain Score 0     Pain Loc      Pain Education      Exclude from Growth Chart    No data found.  Updated Vital Signs BP 114/75 (BP Location: Left Arm)   Pulse 60   Temp 98.1 F (36.7 C) (Oral)   Resp 18   SpO2 98%   Visual Acuity Right Eye Distance:   Left Eye Distance:   Bilateral Distance:    Right Eye Near:   Left Eye Near:    Bilateral Near:     Physical Exam Vitals and nursing note reviewed.  Constitutional:      General: He is not in acute distress.    Appearance: He is well-developed.  HENT:     Head: Normocephalic and atraumatic.  Eyes:     Conjunctiva/sclera: Conjunctivae normal.  Cardiovascular:     Rate and Rhythm: Normal rate and regular rhythm.     Heart sounds: No murmur heard. Pulmonary:     Effort: Pulmonary effort is normal. No respiratory distress.     Breath sounds: Normal breath sounds.  Abdominal:     Palpations: Abdomen is soft.     Tenderness: There is no abdominal tenderness.  Musculoskeletal:        General: No swelling.     Cervical back: Neck supple.  Skin:    General: Skin is warm and dry.     Capillary Refill: Capillary refill takes less than 2 seconds.  Neurological:     Mental Status: He is alert.  Psychiatric:        Mood and Affect: Mood normal.      UC Treatments / Results  Labs (all labs ordered are listed, but only abnormal results are displayed) Labs Reviewed - No data to display  EKG   Radiology No results found.  Procedures Procedures (including critical care time)  Medications Ordered in UC Medications - No data to display  Initial Impression / Assessment and Plan / UC Course  I have reviewed the triage vital signs and the nursing notes.  Pertinent labs & imaging results that were available during my care of the patient were reviewed by me and considered in my medical decision making (see chart for  details).     Will treat for acute sinusitis.  Antibiotic  prescribed along with cough syrup.  Lungs clear to auscultation.  Vitals within normal limits.  Return precautions discussed.  Supportive care discussed. Final Clinical Impressions(s) / UC Diagnoses   Final diagnoses:  Acute non-recurrent sinusitis, unspecified location  Acute cough     Discharge Instructions      Take antibiotic as prescribed. Can take cough syrup as needed for cough. Can use Flonase for congestion and ear pressure. Drink plenty of fluids. Return if you have new or worsening symptoms.   ED Prescriptions     Medication Sig Dispense Auth. Provider   amoxicillin -clavulanate (AUGMENTIN ) 875-125 MG tablet Take 1 tablet by mouth every 12 (twelve) hours. 14 tablet Ward, Sharetha Newson Z, PA-C   promethazine -dextromethorphan (PROMETHAZINE -DM) 6.25-15 MG/5ML syrup Take 5 mLs by mouth 4 (four) times daily as needed for cough. 118 mL Ward, Harlene PEDLAR, PA-C      PDMP not reviewed this encounter.   Ward, Harlene PEDLAR, PA-C 08/12/24 [phone]

## 2024-09-22 ENCOUNTER — Encounter (HOSPITAL_COMMUNITY): Payer: Self-pay | Admitting: Emergency Medicine

## 2024-09-22 ENCOUNTER — Ambulatory Visit (HOSPITAL_COMMUNITY)
Admission: EM | Admit: 2024-09-22 | Discharge: 2024-09-22 | Disposition: A | Discharge status: Home / Self Care | Payer: Self-pay

## 2024-09-22 DIAGNOSIS — K1120 Sialoadenitis, unspecified: Secondary | ICD-10-CM

## 2024-09-22 NOTE — ED Triage Notes (Signed)
 Pt reports pain and swelling to tongue for a few days. Reports hurts to eat.

## 2024-09-22 NOTE — ED Provider Notes (Signed)
 [facility]    CSN: [id number] Arrival date & time: 09/22/24  1357      History   Chief Complaint Chief Complaint  Patient presents with   Oral Pain    HPI Steven Porter is a 33 y.o. male.  2 days of pain under the tongue and under the jaw Full feeling. Worsens when he eats or salivates Tolerating fluids. No dysphagia. No difficult breathing. Denies swelling of lips or tongue. Denies fever, chills, headache, fatigue, rash, groin swelling No dental pain  Has not attempted intervention yet  Reports history of this maybe years ago  Past Medical History:  Diagnosis Date   ADD (attention deficit disorder)    Learning disorder     Patient Active Problem List   Diagnosis Date Noted   Headache 08/07/2012   Adjustment disorder with anxiety 08/07/2012   Visit for preventive health examination 08/07/2012   Tobacco use 08/07/2012   Screening for STD (sexually transmitted disease) 05/08/2011   Wears glasses 05/08/2011   ACNE NEC 08/17/2007   Attention deficit disorder 07/11/2007   LEARNING DISABILITY 07/11/2007    History reviewed. No pertinent surgical history.     Home Medications    Prior to Admission medications   Medication Sig Start Date End Date Taking? Authorizing Provider  amoxicillin -clavulanate (AUGMENTIN ) 875-125 MG tablet Take 1 tablet by mouth every 12 (twelve) hours. 08/12/24   Ward, Jessica Z, PA-C  ibuprofen  (ADVIL ) 800 MG tablet Take 1 tablet (800 mg total) by mouth 3 (three) times daily. 04/14/23   Naquan Garman, [place], PA-C  lidocaine  (LIDODERM ) 5 % Place 1 patch onto the skin daily. Remove & Discard patch within 12 hours 04/14/23   Keta Vanvalkenburgh, [place], PA-C  promethazine -dextromethorphan (PROMETHAZINE -DM) 6.25-15 MG/5ML syrup Take 5 mLs by mouth 4 (four) times daily as needed for cough. 08/12/24   Ward, Jessica Z, PA-C  tiZANidine  (ZANAFLEX ) 4 MG capsule Take 1 capsule (4 mg total) by mouth 3 (three) times daily as needed for muscle spasms. 04/14/23    Lovenia Debruler, [place], PA-C  omeprazole  (PRILOSEC) 20 MG capsule Take 1 capsule (20 mg total) by mouth 2 (two) times daily before a meal. 04/13/19 08/01/19  Maranda Jamee Jacob, MD  simethicone  (GAS-X) 80 MG chewable tablet Chew 1 tablet (80 mg total) by mouth every 6 (six) hours as needed for flatulence. 04/13/19 08/01/19  Maranda Jamee Jacob, MD    Family History Family History  Problem Relation Age of Onset   Hypertension Mother    Sudden death Father    Arthritis Other    Urolithiasis Other    Colon cancer Other        GGF   Other Other        HD Stroke Ht in Gps    Social History Social History   Tobacco Use   Smoking status: Former   Smokeless tobacco: Former    Quit date: 08/04/2013  Vaping Use   Vaping status: Every Day  Substance Use Topics   Alcohol use: Yes    Comment: ocassionaly   Drug use: Yes    Types: Marijuana    Comment: In the past     Allergies   Patient has no known allergies.   Review of Systems Review of Systems  As per HPI  Physical Exam Triage Vital Signs ED Triage Vitals  Encounter Vitals Group     BP 09/22/24 1522 131/77     Girls Systolic BP Percentile --      Girls Diastolic BP  Percentile --      Boys Systolic BP Percentile --      Boys Diastolic BP Percentile --      Pulse Rate 09/22/24 1522 67     Resp 09/22/24 1522 15     Temp 09/22/24 1522 98.2 F (36.8 C)     Temp Source 09/22/24 1522 Oral     SpO2 09/22/24 1522 98 %     Weight --      Height --      Head Circumference --      Peak Flow --      Pain Score 09/22/24 1521 8     Pain Loc --      Pain Education --      Exclude from Growth Chart --    No data found.  Updated Vital Signs BP 131/77 (BP Location: Left Arm)   Pulse 67   Temp 98.2 F (36.8 C) (Oral)   Resp 15   SpO2 98%    Physical Exam Vitals and nursing note reviewed.  Constitutional:      Appearance: He is not ill-appearing.  HENT:     Nose: No rhinorrhea.     Mouth/Throat:     Mouth: Mucous membranes  are moist. No oral lesions or angioedema.     Tongue: No lesions.     Pharynx: Oropharynx is clear. Uvula midline. No posterior oropharyngeal erythema or uvula swelling.     Comments: Tenderness of sublingual salivary gland ducts with minor swelling. Submandibular discomfort with palpation. No lymphadenopathy. No swelling of tongue, no oral lesions. No dental pain. Tolerates secretions Eyes:     Conjunctiva/sclera: Conjunctivae normal.  Cardiovascular:     Rate and Rhythm: Normal rate and regular rhythm.     Pulses: Normal pulses.     Heart sounds: Normal heart sounds.  Pulmonary:     Effort: Pulmonary effort is normal.     Breath sounds: Normal breath sounds.  Musculoskeletal:     Cervical back: Normal range of motion.  Lymphadenopathy:     Cervical: No cervical adenopathy.  Skin:    General: Skin is warm and dry.  Neurological:     Mental Status: He is alert and oriented to person, place, and time.     UC Treatments / Results  Labs (all labs ordered are listed, but only abnormal results are displayed) Labs Reviewed - No data to display  EKG  Radiology No results found.  Procedures Procedures  Medications Ordered in UC Medications - No data to display  Initial Impression / Assessment and Plan / UC Course  I have reviewed the triage vital signs and the nursing notes.  Pertinent labs & imaging results that were available during my care of the patient were reviewed by me and considered in my medical decision making (see chart for details).  Afebrile, well appearing.  Suspect sialadenitis. Likely stones or viral. Less concern for bacterial infection. Advised supportive care; sour candies, oral hydration, warm compress, tylenol /ibuprofen , gentle massage. Close monitoring of symptoms. Advised return and ED precautions. Patient agreeable with plan, all questions answered  Final Clinical Impressions(s) / UC Diagnoses   Final diagnoses:  Sialadenitis     Discharge  Instructions      I suspect inflammation of your salivary glands. This is treated supportively. Suck on sour candy several times daily to promote the flow of saliva, which will open the glands and clear any obstruction out of the way. Gargle/swish with salt water several times daily. Drink  lots of fluids! Use ibuprofen  and tylenol  for pain control   Please monitor symptoms and return if needed Please go to the emergency department if symptoms worsen in the next 48 hours    ED Prescriptions   None    PDMP not reviewed this encounter.   Kimon Loewen, [place], PA-C 09/22/24 1705

## 2024-09-22 NOTE — Discharge Instructions (Addendum)
 I suspect inflammation of your salivary glands. This is treated supportively. Suck on sour candy several times daily to promote the flow of saliva, which will open the glands and clear any obstruction out of the way. Gargle/swish with salt water several times daily. Drink lots of fluids! Use ibuprofen  and tylenol  for pain control   Please monitor symptoms and return if needed Please go to the emergency department if symptoms worsen in the next 48 hours
